# Patient Record
Sex: Female | Born: 1990 | Race: Black or African American | Hispanic: No | Marital: Single | Smoking: Current some day smoker
Health system: Southern US, Community
[De-identification: ages and names within clinical notes are randomized; demographics above are authoritative.]

## PROBLEM LIST (undated history)

## (undated) ENCOUNTER — Inpatient Hospital Stay (HOSPITAL_COMMUNITY): Payer: Self-pay

## (undated) DIAGNOSIS — Z8619 Personal history of other infectious and parasitic diseases: Secondary | ICD-10-CM

## (undated) DIAGNOSIS — J45909 Unspecified asthma, uncomplicated: Secondary | ICD-10-CM

## (undated) HISTORY — DX: Personal history of other infectious and parasitic diseases: Z86.19

## (undated) HISTORY — PX: FINGER SURGERY: SHX640

## (undated) HISTORY — PX: WISDOM TOOTH EXTRACTION: SHX21

---
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
## 2010-02-13 ENCOUNTER — Ambulatory Visit (HOSPITAL_COMMUNITY)
Admission: RE | Admit: 2010-02-13 | Discharge: 2010-02-13 | Payer: Self-pay | Source: Home / Self Care | Attending: Family Medicine | Admitting: Family Medicine

## 2010-02-24 ENCOUNTER — Inpatient Hospital Stay (HOSPITAL_COMMUNITY)
Admission: AD | Admit: 2010-02-24 | Discharge: 2010-02-27 | Payer: Self-pay | Source: Home / Self Care | Attending: Obstetrics & Gynecology | Admitting: Obstetrics & Gynecology

## 2010-02-26 LAB — RAPID URINE DRUG SCREEN, HOSP PERFORMED
Amphetamines: NOT DETECTED
Barbiturates: NOT DETECTED
Benzodiazepines: NOT DETECTED
Cocaine: NOT DETECTED
Opiates: NOT DETECTED
Tetrahydrocannabinol: NOT DETECTED

## 2010-02-26 LAB — CBC
HCT: 32 % — ABNORMAL LOW (ref 36.0–46.0)
Hemoglobin: 10.6 g/dL — ABNORMAL LOW (ref 12.0–15.0)
MCH: 28 pg (ref 26.0–34.0)
MCHC: 33.1 g/dL (ref 30.0–36.0)
MCV: 84.4 fL (ref 78.0–100.0)
Platelets: 188 10*3/uL (ref 150–400)
RBC: 3.79 MIL/uL — ABNORMAL LOW (ref 3.87–5.11)
RDW: 16.5 % — ABNORMAL HIGH (ref 11.5–15.5)
WBC: 11.1 10*3/uL — ABNORMAL HIGH (ref 4.0–10.5)

## 2010-02-26 LAB — RAPID HIV SCREEN (WH-MAU): Rapid HIV Screen: NONREACTIVE

## 2010-02-26 LAB — ABO/RH: ABO/RH(D): O POS

## 2010-02-26 LAB — RPR: RPR Ser Ql: NONREACTIVE

## 2010-02-28 LAB — CHLAMYDIA PROBE AMPLIFICATION, URINE: Chlamydia, Swab/Urine, PCR: NEGATIVE

## 2010-04-19 ENCOUNTER — Ambulatory Visit: Payer: Self-pay | Admitting: Obstetrics & Gynecology

## 2014-03-26 ENCOUNTER — Emergency Department (HOSPITAL_COMMUNITY)
Admission: EM | Admit: 2014-03-26 | Discharge: 2014-03-26 | Disposition: A | Discharge status: Home / Self Care | Payer: Medicaid Other | Attending: Emergency Medicine | Admitting: Emergency Medicine

## 2014-03-26 ENCOUNTER — Encounter (HOSPITAL_COMMUNITY): Payer: Self-pay | Admitting: Emergency Medicine

## 2014-03-26 ENCOUNTER — Emergency Department (HOSPITAL_COMMUNITY): Payer: Medicaid Other

## 2014-03-26 DIAGNOSIS — O99511 Diseases of the respiratory system complicating pregnancy, first trimester: Secondary | ICD-10-CM | POA: Diagnosis not present

## 2014-03-26 DIAGNOSIS — O9989 Other specified diseases and conditions complicating pregnancy, childbirth and the puerperium: Secondary | ICD-10-CM | POA: Diagnosis not present

## 2014-03-26 DIAGNOSIS — Z3A10 10 weeks gestation of pregnancy: Secondary | ICD-10-CM | POA: Insufficient documentation

## 2014-03-26 DIAGNOSIS — O209 Hemorrhage in early pregnancy, unspecified: Secondary | ICD-10-CM | POA: Diagnosis present

## 2014-03-26 DIAGNOSIS — R1084 Generalized abdominal pain: Secondary | ICD-10-CM | POA: Diagnosis not present

## 2014-03-26 DIAGNOSIS — F17200 Nicotine dependence, unspecified, uncomplicated: Secondary | ICD-10-CM | POA: Diagnosis not present

## 2014-03-26 DIAGNOSIS — J45909 Unspecified asthma, uncomplicated: Secondary | ICD-10-CM | POA: Insufficient documentation

## 2014-03-26 DIAGNOSIS — O99331 Smoking (tobacco) complicating pregnancy, first trimester: Secondary | ICD-10-CM | POA: Diagnosis not present

## 2014-03-26 DIAGNOSIS — Z3490 Encounter for supervision of normal pregnancy, unspecified, unspecified trimester: Secondary | ICD-10-CM

## 2014-03-26 HISTORY — DX: Unspecified asthma, uncomplicated: J45.909

## 2014-03-26 LAB — URINALYSIS, ROUTINE W REFLEX MICROSCOPIC
Bilirubin Urine: NEGATIVE
Glucose, UA: NEGATIVE mg/dL
Hgb urine dipstick: NEGATIVE
Ketones, ur: NEGATIVE mg/dL
Leukocytes, UA: NEGATIVE
Nitrite: NEGATIVE
Protein, ur: NEGATIVE mg/dL
Specific Gravity, Urine: 1.022 (ref 1.005–1.030)
Urobilinogen, UA: 1 mg/dL (ref 0.0–1.0)
pH: 7 (ref 5.0–8.0)

## 2014-03-26 LAB — COMPREHENSIVE METABOLIC PANEL
ALT: 11 U/L (ref 0–35)
AST: 18 U/L (ref 0–37)
Albumin: 3.6 g/dL (ref 3.5–5.2)
Alkaline Phosphatase: 55 U/L (ref 39–117)
Anion gap: 8 (ref 5–15)
BUN: <5 mg/dL — ABNORMAL LOW (ref 6–23)
CO2: 24 mmol/L (ref 19–32)
Calcium: 9 mg/dL (ref 8.4–10.5)
Chloride: 103 mmol/L (ref 96–112)
Creatinine, Ser: 0.61 mg/dL (ref 0.50–1.10)
GFR calc Af Amer: >90 mL/min (ref >90)
GFR calc non Af Amer: >90 mL/min (ref >90)
Glucose, Bld: 101 mg/dL — ABNORMAL HIGH (ref 70–99)
Potassium: 3 mmol/L — ABNORMAL LOW (ref 3.5–5.1)
Sodium: 135 mmol/L (ref 135–145)
Total Bilirubin: 0.7 mg/dL (ref 0.3–1.2)
Total Protein: 6.8 g/dL (ref 6.0–8.3)

## 2014-03-26 LAB — CBC WITH DIFFERENTIAL/PLATELET
Basophils Absolute: 0 10*3/uL (ref 0.0–0.1)
Basophils Relative: 0 % (ref 0–1)
Eosinophils Absolute: 0.1 10*3/uL (ref 0.0–0.7)
Eosinophils Relative: 1 % (ref 0–5)
HCT: 35.3 % — ABNORMAL LOW (ref 36.0–46.0)
Hemoglobin: 11.4 g/dL — ABNORMAL LOW (ref 12.0–15.0)
Lymphocytes Relative: 28 % (ref 12–46)
Lymphs Abs: 1.6 10*3/uL (ref 0.7–4.0)
MCH: 27.3 pg (ref 26.0–34.0)
MCHC: 32.3 g/dL (ref 30.0–36.0)
MCV: 84.4 fL (ref 78.0–100.0)
Monocytes Absolute: 0.4 10*3/uL (ref 0.1–1.0)
Monocytes Relative: 7 % (ref 3–12)
Neutro Abs: 3.6 10*3/uL (ref 1.7–7.7)
Neutrophils Relative %: 64 % (ref 43–77)
Platelets: 215 10*3/uL (ref 150–400)
RBC: 4.18 MIL/uL (ref 3.87–5.11)
RDW: 13.8 % (ref 11.5–15.5)
WBC: 5.7 10*3/uL (ref 4.0–10.5)

## 2014-03-26 LAB — WET PREP, GENITAL
Clue Cells Wet Prep HPF POC: NONE SEEN
Trich, Wet Prep: NONE SEEN
Yeast Wet Prep HPF POC: NONE SEEN

## 2014-03-26 LAB — POC URINE PREG, ED: Preg Test, Ur: POSITIVE — AB

## 2014-03-26 MED ORDER — POTASSIUM CHLORIDE CRYS ER 20 MEQ PO TBCR
40.0000 meq | EXTENDED_RELEASE_TABLET | Freq: Once | ORAL | Status: AC
  Administered 2014-03-26: 40 meq via ORAL
  Filled 2014-03-26: qty 2
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
## 2014-03-26 NOTE — ED Notes (Signed)
Pt returned from US
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
## 2014-03-26 NOTE — ED Notes (Signed)
US called to notify nurse that pt was 3rd in line for US procedure.

## 2014-03-26 NOTE — ED Notes (Signed)
Pt made aware to return if symptoms worsen or if any life threatening symptoms occur.  Dr. Freida BusmanAllen made aware of bp, okay to send pt home.
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
## 2014-03-28 LAB — GC/CHLAMYDIA PROBE AMP (~~LOC~~) NOT AT ARMC
Chlamydia: NEGATIVE
Neisseria Gonorrhea: NEGATIVE

## 2014-06-01 ENCOUNTER — Emergency Department (HOSPITAL_COMMUNITY)
Admission: EM | Admit: 2014-06-01 | Discharge: 2014-06-01 | Disposition: A | Discharge status: Home / Self Care | Payer: Medicaid Other | Attending: Emergency Medicine | Admitting: Emergency Medicine

## 2014-06-01 ENCOUNTER — Encounter (HOSPITAL_COMMUNITY): Payer: Self-pay

## 2014-06-01 DIAGNOSIS — O99331 Smoking (tobacco) complicating pregnancy, first trimester: Secondary | ICD-10-CM | POA: Diagnosis not present

## 2014-06-01 DIAGNOSIS — O99511 Diseases of the respiratory system complicating pregnancy, first trimester: Secondary | ICD-10-CM | POA: Diagnosis not present

## 2014-06-01 DIAGNOSIS — F17210 Nicotine dependence, cigarettes, uncomplicated: Secondary | ICD-10-CM | POA: Insufficient documentation

## 2014-06-01 DIAGNOSIS — O209 Hemorrhage in early pregnancy, unspecified: Secondary | ICD-10-CM | POA: Diagnosis present

## 2014-06-01 DIAGNOSIS — Z3491 Encounter for supervision of normal pregnancy, unspecified, first trimester: Secondary | ICD-10-CM

## 2014-06-01 DIAGNOSIS — J45909 Unspecified asthma, uncomplicated: Secondary | ICD-10-CM | POA: Diagnosis not present

## 2014-06-01 DIAGNOSIS — Z3A00 Weeks of gestation of pregnancy not specified: Secondary | ICD-10-CM | POA: Insufficient documentation

## 2014-06-01 LAB — WET PREP, GENITAL
CLUE CELLS WET PREP: NONE SEEN
TRICH WET PREP: NONE SEEN
Yeast Wet Prep HPF POC: NONE SEEN

## 2014-06-01 LAB — URINALYSIS, ROUTINE W REFLEX MICROSCOPIC
Bilirubin Urine: NEGATIVE
Glucose, UA: NEGATIVE mg/dL
Hgb urine dipstick: NEGATIVE
Ketones, ur: NEGATIVE mg/dL
Nitrite: NEGATIVE
Protein, ur: NEGATIVE mg/dL
Specific Gravity, Urine: 1.017 (ref 1.005–1.030)
Urobilinogen, UA: 1 mg/dL (ref 0.0–1.0)
pH: 7 (ref 5.0–8.0)

## 2014-06-01 LAB — URINE MICROSCOPIC-ADD ON

## 2014-06-01 LAB — POC URINE PREG, ED: Preg Test, Ur: POSITIVE — AB

## 2014-06-01 LAB — HCG, QUANTITATIVE, PREGNANCY: hCG, Beta Chain, Quant, S: 53692 m[IU]/mL — ABNORMAL HIGH (ref <5)

## 2014-06-01 NOTE — ED Notes (Signed)
Pt reports vaginal spotting with lower abdominal pain that began yesterday.  Pt reports her LMP was at the beginning of December and she has her first OB appointment tomorrow.  Pt reports she has a hx of ovarian cysts with her previous pregnancy.

## 2014-06-01 NOTE — ED Notes (Signed)
Pelvic cart at bedside. 

## 2014-06-01 NOTE — ED Provider Notes (Signed)
CSN: [id number]     Arrival date & time 06/01/14  [id number] History   First MD Initiated Contact with Patient 06/01/14 [phone]     Chief Complaint  Patient presents with  . Vaginal Bleeding     (Consider location/radiation/quality/duration/timing/severity/associated sxs/prior Treatment) HPI   Sheryl Berg is a 24 y.o. female who presents for evaluation of lower abdominal pain present for several days, on and off, similar to prior pain when she had a "ovarian cyst during her previous pregnancy several years ago. She is also had some spotting, per vagina. She denies weakness, dizziness, nausea or vomiting. She has a prenatal appointment scheduled for tomorrow. Her last menstrual period is 01/17/2014. She previously had a pelvic ultrasound done to verify intrauterine pregnancy, 2 months ago. She denies fever, chills, cough, shortness breath or chest pain.   Past Medical History  Diagnosis Date  . Asthma    History reviewed. No pertinent past surgical history. History reviewed. No pertinent family history. History  Substance Use Topics  . Smoking status: Current Some Day Smoker  . Smokeless tobacco: Not on file  . Alcohol Use: No   OB History    Gravida Para Term Preterm AB TAB SAB Ectopic Multiple Living   1              Review of Systems  All other systems reviewed and are negative.     Allergies  Review of patient's allergies indicates no known allergies.  Home Medications   Prior to Admission medications   Medication Sig Start Date End Date Taking? Authorizing Provider  Prenatal Vit-Fe Fumarate-FA (PRENATAL PO) Take 1 tablet by mouth daily.   Yes Historical Provider, MD   BP 108/49 mmHg  Pulse 56  Temp(Src) 98.9 F (37.2 C)  SpO2 100%  LMP 01/17/2014 (Approximate) Physical Exam  Constitutional: She is oriented to person, place, and time. She appears well-developed and well-nourished. No distress.  HENT:  Head: Normocephalic and atraumatic.  Right Ear: External  ear normal.  Left Ear: External ear normal.  Eyes: Conjunctivae and EOM are normal. Pupils are equal, round, and reactive to light.  Neck: Normal range of motion and phonation normal. Neck supple.  Cardiovascular: Normal rate, regular rhythm and normal heart sounds.   Pulmonary/Chest: Effort normal and breath sounds normal. She exhibits no bony tenderness.  Abdominal: Soft. There is no tenderness.  Genitourinary:  Normal external female genitalia. Small amount of white vaginal discharge, wet prep sent. On bimanual examination the uterus is enlarged. There is no adnexal mass. There is mild diffuse right-sided adnexal tenderness. Cervix is long and closed. No blood in vagina.  Musculoskeletal: Normal range of motion.  Neurological: She is alert and oriented to person, place, and time. No cranial nerve deficit or sensory deficit. She exhibits normal muscle tone. Coordination normal.  Skin: Skin is warm, dry and intact.  Psychiatric: She has a normal mood and affect. Her behavior is normal. Judgment and thought content normal.  Nursing note and vitals reviewed.   ED Course  Procedures (including critical care time)  Fetal heart tones 144, normal.   Medications - No data to display  Patient Vitals for the past 24 hrs:  BP Temp Pulse SpO2  06/01/14 0844 (!) 108/49 mmHg 98.9 F (37.2 C) (!) 56 100 %    12:45 PM Reevaluation with update and discussion. After initial assessment and treatment, an updated evaluation reveals no further complaints. She remains comfortable and cooperative.Mancel Bale. Shauntelle Jamerson L    Medications - No  data to display  Patient Vitals for the past 24 hrs:  BP Temp Pulse SpO2  06/01/14 0844 (!) 108/49 mmHg 98.9 F (37.2 C) (!) 56 100 %    1:09 PM Reevaluation with update and discussion. After initial assessment and treatment, an updated evaluation reveals she is comfortable. No further c/o. Findings discussed with patient, all questions answered.Mancel Bale L     Labs Review Labs Reviewed  HCG, QUANTITATIVE, PREGNANCY - Abnormal; Notable for the following:    hCG, Beta Chain, Quant, [place] [address] (*)    All other components within normal limits  URINALYSIS, ROUTINE W REFLEX MICROSCOPIC - Abnormal; Notable for the following:    Leukocytes, UA TRACE (*)    All other components within normal limits  URINE MICROSCOPIC-ADD ON - Abnormal; Notable for the following:    Squamous Epithelial / LPF FEW (*)    Casts HYALINE CASTS (*)    All other components within normal limits  POC URINE PREG, ED - Abnormal; Notable for the following:    Preg Test, Ur POSITIVE (*)    All other components within normal limits  RPR  HIV ANTIBODY (ROUTINE TESTING)  GC/CHLAMYDIA PROBE AMP ([place])    Imaging Review No results found.   EKG Interpretation None      MDM   Final diagnoses:  None    Low abdominal pain in early pregnancy, most likely round ligament pain. She reports vaginal bleeding, but none seen on clinical examination. She has short-term follow-up scheduled for tomorrow with her new obstetrician for this pregnancy. There is no indication for further evaluation or treatment in the ED setting at this time.  Nursing Notes Reviewed/ Care Coordinated Applicable Imaging Reviewed Interpretation of Laboratory Data incorporated into ED treatment  The patient appears reasonably screened and/or stabilized for discharge and I doubt any other medical condition or other [facility] requiring further screening, evaluation, or treatment in the ED at this time prior to discharge.  Plan: Home Medications- usual; Home Treatments- rest; return here if the recommended treatment, does not improve the symptoms; Recommended follow up- OB tomorrow    Mancel Bale, MD 06/01/14 1313

## 2014-06-01 NOTE — Discharge Instructions (Signed)
First Trimester of Pregnancy The first trimester of pregnancy is from week 1 until the end of week 12 (months 1 through 3). A week after a sperm fertilizes an egg, the egg will implant on the wall of the uterus. This embryo will begin to develop into a baby. Genes from you and your partner are forming the baby. The female genes determine whether the baby is a boy or a girl. At 6-8 weeks, the eyes and face are formed, and the heartbeat can be seen on ultrasound. At the end of 12 weeks, all the baby's organs are formed.  Now that you are pregnant, you will want to do everything you can to have a healthy baby. Two of the most important things are to get good prenatal care and to follow your health care provider's instructions. Prenatal care is all the medical care you receive before the baby's birth. This care will help prevent, find, and treat any problems during the pregnancy and childbirth. BODY CHANGES Your body goes through many changes during pregnancy. The changes vary from woman to woman.   You may gain or lose a couple of pounds at first.  You may feel sick to your stomach (nauseous) and throw up (vomit). If the vomiting is uncontrollable, call your health care provider.  You may tire easily.  You may develop headaches that can be relieved by medicines approved by your health care provider.  You may urinate more often. Painful urination may mean you have a bladder infection.  You may develop heartburn as a result of your pregnancy.  You may develop constipation because certain hormones are causing the muscles that push waste through your intestines to slow down.  You may develop hemorrhoids or swollen, bulging veins (varicose veins).  Your breasts may begin to grow larger and become tender. Your nipples may stick out more, and the tissue that surrounds them (areola) may become darker.  Your gums may bleed and may be sensitive to brushing and flossing.  Dark spots or blotches (chloasma,  mask of pregnancy) may develop on your face. This will likely fade after the baby is born.  Your menstrual periods will stop.  You may have a loss of appetite.  You may develop cravings for certain kinds of food.  You may have changes in your emotions from day to day, such as being excited to be pregnant or being concerned that something may go wrong with the pregnancy and baby.  You may have more vivid and strange dreams.  You may have changes in your hair. These can include thickening of your hair, rapid growth, and changes in texture. Some women also have hair loss during or after pregnancy, or hair that feels dry or thin. Your hair will most likely return to normal after your baby is born. WHAT TO EXPECT AT YOUR PRENATAL VISITS During a routine prenatal visit:  You will be weighed to make sure you and the baby are growing normally.  Your blood pressure will be taken.  Your abdomen will be measured to track your baby's growth.  The fetal heartbeat will be listened to starting around week 10 or 12 of your pregnancy.  Test results from any previous visits will be discussed. Your health care provider may ask you:  How you are feeling.  If you are feeling the baby move.  If you have had any abnormal symptoms, such as leaking fluid, bleeding, severe headaches, or abdominal cramping.  If you have any questions. Other tests   that may be performed during your first trimester include:  Blood tests to find your blood type and to check for the presence of any previous infections. They will also be used to check for low iron levels (anemia) and Rh antibodies. Later in the pregnancy, blood tests for diabetes will be done along with other tests if problems develop.  Urine tests to check for infections, diabetes, or protein in the urine.  An ultrasound to confirm the proper growth and development of the baby.  An amniocentesis to check for possible genetic problems.  Fetal screens for  spina bifida and Down syndrome.  You may need other tests to make sure you and the baby are doing well. HOME CARE INSTRUCTIONS  Medicines  Follow your health care provider's instructions regarding medicine use. Specific medicines may be either safe or unsafe to take during pregnancy.  Take your prenatal vitamins as directed.  If you develop constipation, try taking a stool softener if your health care provider approves. Diet  Eat regular, well-balanced meals. Choose a variety of foods, such as meat or vegetable-based protein, fish, milk and low-fat dairy products, vegetables, fruits, and whole grain breads and cereals. Your health care provider will help you determine the amount of weight gain that is right for you.  Avoid raw meat and uncooked cheese. These carry germs that can cause birth defects in the baby.  Eating four or five small meals rather than three large meals a day may help relieve nausea and vomiting. If you start to feel nauseous, eating a few soda crackers can be helpful. Drinking liquids between meals instead of during meals also seems to help nausea and vomiting.  If you develop constipation, eat more high-fiber foods, such as fresh vegetables or fruit and whole grains. Drink enough fluids to keep your urine clear or pale yellow. Activity and Exercise  Exercise only as directed by your health care provider. Exercising will help you:  Control your weight.  Stay in shape.  Be prepared for labor and delivery.  Experiencing pain or cramping in the lower abdomen or low back is a good sign that you should stop exercising. Check with your health care provider before continuing normal exercises.  Try to avoid standing for long periods of time. Move your legs often if you must stand in one place for a long time.  Avoid heavy lifting.  Wear low-heeled shoes, and practice good posture.  You may continue to have sex unless your health care provider directs you  otherwise. Relief of Pain or Discomfort  Wear a good support bra for breast tenderness.   Take warm sitz baths to soothe any pain or discomfort caused by hemorrhoids. Use hemorrhoid cream if your health care provider approves.   Rest with your legs elevated if you have leg cramps or low back pain.  If you develop varicose veins in your legs, wear support hose. Elevate your feet for 15 minutes, 3-4 times a day. Limit salt in your diet. Prenatal Care  Schedule your prenatal visits by the twelfth week of pregnancy. They are usually scheduled monthly at first, then more often in the last 2 months before delivery.  Write down your questions. Take them to your prenatal visits.  Keep all your prenatal visits as directed by your health care provider. Safety  Wear your seat belt at all times when driving.  Make a list of emergency phone numbers, including numbers for family, friends, the hospital, and police and fire departments. General Tips    Ask your health care provider for a referral to a local prenatal education class. Begin classes no later than at the beginning of month 6 of your pregnancy.  Ask for help if you have counseling or nutritional needs during pregnancy. Your health care provider can offer advice or refer you to specialists for help with various needs.  Do not use hot tubs, steam rooms, or saunas.  Do not douche or use tampons or scented sanitary pads.  Do not cross your legs for long periods of time.  Avoid cat litter boxes and soil used by cats. These carry germs that can cause birth defects in the baby and possibly loss of the fetus by miscarriage or stillbirth.  Avoid all smoking, herbs, alcohol, and medicines not prescribed by your health care provider. Chemicals in these affect the formation and growth of the baby.  Schedule a dentist appointment. At home, brush your teeth with a soft toothbrush and be gentle when you floss. SEEK MEDICAL CARE IF:   You have  dizziness.  You have mild pelvic cramps, pelvic pressure, or nagging pain in the abdominal area.  You have persistent nausea, vomiting, or diarrhea.  You have a bad smelling vaginal discharge.  You have pain with urination.  You notice increased swelling in your face, hands, legs, or ankles. SEEK IMMEDIATE MEDICAL CARE IF:   You have a fever.  You are leaking fluid from your vagina.  You have spotting or bleeding from your vagina.  You have severe abdominal cramping or pain.  You have rapid weight gain or loss.  You vomit blood or material that looks like coffee grounds.  You are exposed to German measles and have never had them.  You are exposed to fifth disease or chickenpox.  You develop a severe headache.  You have shortness of breath.  You have any kind of trauma, such as from a fall or a car accident. Document Released: 01/22/2001 Document Revised: 06/14/2013 Document Reviewed: 12/08/2012 ExitCare Patient Information 2015 [employer]. This information is not intended to replace advice given to you by your health care provider. Make sure you discuss any questions you have with your health care provider.  

## 2014-06-02 LAB — GC/CHLAMYDIA PROBE AMP (~~LOC~~) NOT AT ARMC
Chlamydia: NEGATIVE
Neisseria Gonorrhea: NEGATIVE

## 2014-06-02 LAB — HIV ANTIBODY (ROUTINE TESTING W REFLEX): HIV SCREEN 4TH GENERATION: NONREACTIVE

## 2014-06-02 LAB — RPR: RPR Ser Ql: NONREACTIVE

## 2014-06-09 LAB — OB RESULTS CONSOLE RPR: RPR: NONREACTIVE

## 2014-06-09 LAB — OB RESULTS CONSOLE HEPATITIS B SURFACE ANTIGEN: Hepatitis B Surface Ag: NEGATIVE

## 2014-06-09 LAB — OB RESULTS CONSOLE ABO/RH: RH TYPE: POSITIVE

## 2014-06-09 LAB — OB RESULTS CONSOLE HIV ANTIBODY (ROUTINE TESTING): HIV: NONREACTIVE

## 2014-06-09 LAB — OB RESULTS CONSOLE ANTIBODY SCREEN: Antibody Screen: NEGATIVE

## 2014-06-09 LAB — OB RESULTS CONSOLE RUBELLA ANTIBODY, IGM: Rubella: IMMUNE

## 2014-06-09 LAB — OB RESULTS CONSOLE GC/CHLAMYDIA: Gonorrhea: NEGATIVE

## 2014-10-17 ENCOUNTER — Inpatient Hospital Stay (HOSPITAL_COMMUNITY)
Admission: AD | Admit: 2014-10-17 | Discharge: 2014-10-17 | Disposition: A | Discharge status: Home / Self Care | Payer: Medicaid Other | Source: Ambulatory Visit | Attending: Obstetrics and Gynecology | Admitting: Obstetrics and Gynecology

## 2014-10-17 ENCOUNTER — Encounter (HOSPITAL_COMMUNITY): Payer: Self-pay

## 2014-10-17 DIAGNOSIS — Z3A39 39 weeks gestation of pregnancy: Secondary | ICD-10-CM | POA: Diagnosis not present

## 2014-10-17 DIAGNOSIS — O283 Abnormal ultrasonic finding on antenatal screening of mother: Secondary | ICD-10-CM | POA: Diagnosis present

## 2014-10-17 NOTE — Progress Notes (Signed)
Pt discharged home and instructions reviewed. All belongings with pt upon discharge.

## 2014-10-17 NOTE — Progress Notes (Signed)
Pt here for NST per Dr. Ambrose Mantle. Pt states, "they've been watching her growth because I guess she has been measuring on the smaller size."

## 2014-10-17 NOTE — Progress Notes (Signed)
Pt reports good fetal movement, no loss of fluid or bleeding.

## 2014-10-17 NOTE — Discharge Instructions (Signed)
Braxton Hicks Contractions °Contractions of the uterus can occur throughout pregnancy. Contractions are not always a sign that you are in labor.  °WHAT ARE BRAXTON HICKS CONTRACTIONS?  °Contractions that occur before labor are called Braxton Hicks contractions, or false labor. Toward the end of pregnancy (32-34 weeks), these contractions can develop more often and may become more forceful. This is not true labor because these contractions do not result in opening (dilatation) and thinning of the cervix. They are sometimes difficult to tell apart from true labor because these contractions can be forceful and people have different pain tolerances. You should not feel embarrassed if you go to the hospital with false labor. Sometimes, the only way to tell if you are in true labor is for your health care provider to look for changes in the cervix. °If there are no prenatal problems or other health problems associated with the pregnancy, it is completely safe to be sent home with false labor and await the onset of true labor. °HOW CAN YOU TELL THE DIFFERENCE BETWEEN TRUE AND FALSE LABOR? °False Labor °· The contractions of false labor are usually shorter and not as hard as those of true labor.   °· The contractions are usually irregular.   °· The contractions are often felt in the front of the lower abdomen and in the groin.   °· The contractions may go away when you walk around or change positions while lying down.   °· The contractions get weaker and are shorter lasting as time goes on.   °· The contractions do not usually become progressively stronger, regular, and closer together as with true labor.   °True Labor °· Contractions in true labor last 30-70 seconds, become very regular, usually become more intense, and increase in frequency.   °· The contractions do not go away with walking.   °· The discomfort is usually felt in the top of the uterus and spreads to the lower abdomen and low back.   °· True labor can be  determined by your health care provider with an exam. This will show that the cervix is dilating and getting thinner.   °WHAT TO REMEMBER °· Keep up with your usual exercises and follow other instructions given by your health care provider.   °· Take medicines as directed by your health care provider.   °· Keep your regular prenatal appointments.   °· Eat and drink lightly if you think you are going into labor.   °· If Braxton Hicks contractions are making you uncomfortable:   °¨ Change your position from lying down or resting to walking, or from walking to resting.   °¨ Sit and rest in a tub of warm water.   °¨ Drink 2-3 glasses of water. Dehydration may cause these contractions.   °¨ Do slow and deep breathing several times an hour.   °WHEN SHOULD I SEEK IMMEDIATE MEDICAL CARE? °Seek immediate medical care if: °· Your contractions become stronger, more regular, and closer together.   °· You have fluid leaking or gushing from your vagina.   °· You have a fever.   °· You pass blood-tinged mucus.   °· You have vaginal bleeding.   °· You have continuous abdominal pain.   °· You have low back pain that you never had before.   °· You feel your baby's head pushing down and causing pelvic pressure.   °· Your baby is not moving as much as it used to.   °Document Released: 01/28/2005 Document Revised: 02/02/2013 Document Reviewed: 11/09/2012 °ExitCare® Patient Information ©2015 [employer]. This information is not intended to replace advice given to you by your health care   provider. Make sure you discuss any questions you have with your health care provider. ° °

## 2014-10-24 ENCOUNTER — Other Ambulatory Visit (HOSPITAL_COMMUNITY): Payer: Self-pay | Admitting: Obstetrics and Gynecology

## 2014-10-24 ENCOUNTER — Ambulatory Visit (HOSPITAL_COMMUNITY)
Admission: RE | Admit: 2014-10-24 | Discharge: 2014-10-24 | Disposition: A | Discharge status: Home / Self Care | Payer: Medicaid Other | Source: Ambulatory Visit | Attending: Obstetrics and Gynecology | Admitting: Obstetrics and Gynecology

## 2014-10-24 DIAGNOSIS — O288 Other abnormal findings on antenatal screening of mother: Secondary | ICD-10-CM

## 2014-10-24 DIAGNOSIS — Z3A40 40 weeks gestation of pregnancy: Secondary | ICD-10-CM | POA: Diagnosis not present

## 2014-10-24 DIAGNOSIS — O283 Abnormal ultrasonic finding on antenatal screening of mother: Secondary | ICD-10-CM | POA: Diagnosis not present

## 2014-10-24 DIAGNOSIS — O480 Post-term pregnancy: Secondary | ICD-10-CM | POA: Insufficient documentation

## 2014-10-26 ENCOUNTER — Encounter (HOSPITAL_COMMUNITY): Payer: Self-pay | Admitting: *Deleted

## 2014-10-26 ENCOUNTER — Telehealth (HOSPITAL_COMMUNITY): Payer: Self-pay | Admitting: *Deleted

## 2014-10-26 ENCOUNTER — Other Ambulatory Visit (HOSPITAL_COMMUNITY): Payer: Self-pay | Admitting: Obstetrics and Gynecology

## 2014-10-26 DIAGNOSIS — Z3490 Encounter for supervision of normal pregnancy, unspecified, unspecified trimester: Secondary | ICD-10-CM

## 2014-10-26 LAB — OB RESULTS CONSOLE GBS: STREP GROUP B AG: NEGATIVE

## 2014-10-26 MED ORDER — LACTATED RINGERS IV SOLN: INTRAVENOUS | Status: AC

## 2014-10-26 MED ORDER — OXYTOCIN 40 UNITS IN LACTATED RINGERS INFUSION - SIMPLE MED: 1.0000 m[IU]/min | INTRAVENOUS | Status: AC

## 2014-10-26 MED ORDER — OXYTOCIN BOLUS FROM INFUSION: 500.0000 mL | INTRAVENOUS | Status: AC

## 2014-10-26 MED ORDER — OXYCODONE-ACETAMINOPHEN 5-325 MG PO TABS: 1.0000 | ORAL_TABLET | ORAL | Status: AC | PRN

## 2014-10-26 MED ORDER — LACTATED RINGERS IV SOLN: 500.0000 mL | INTRAVENOUS | Status: AC | PRN

## 2014-10-26 MED ORDER — OXYTOCIN 40 UNITS IN LACTATED RINGERS INFUSION - SIMPLE MED: 62.5000 mL/h | INTRAVENOUS | Status: AC

## 2014-10-26 MED ORDER — OXYCODONE-ACETAMINOPHEN 5-325 MG PO TABS: 2.0000 | ORAL_TABLET | ORAL | Status: AC | PRN

## 2014-10-26 MED ORDER — LIDOCAINE HCL (PF) 1 % IJ SOLN: 30.0000 mL | INTRAMUSCULAR | Status: AC | PRN

## 2014-10-26 NOTE — Telephone Encounter (Signed)
Preadmission screen  

## 2014-10-26 NOTE — H&P (Signed)
NAME:  Sheryl Berg, Sheryl Berg NO.:  MEDICAL RECORD NO.:  [id number]  LOCATION:                                 FACILITY:  PHYSICIAN:  Malachi Pro. Ambrose Mantle, M.D. DATE OF BIRTH:  [birth year]  DATE OF ADMISSION:  10/27/2014 DATE OF DISCHARGE:                             HISTORY & PHYSICAL   HISTORY OF PRESENT ILLNESS:  This is a 24 year old black female, para 3- 0-1-3, gravida 5, EDC October 23, 2014, based on an ultrasound at 9 weeks and 6 days on March 26, 2014.  Blood group and type O positive, negative antibody, RPR negative.  Urine culture negative.  Hepatitis B surface antigen negative, HIV negative, GC and Chlamydia negative. Varicella immune.  Rubella immune.  Pap test normal.  Cystic fibrosis negative.  Hemoglobin AA.  Quad screen negative.  Repeat HIV and RPR negative.  GC and Chlamydia negative.  Group B strep negative.  This patient had an early ultrasound.  She began her prenatal course in our office at 20-21 weeks.  At 28 weeks, she had an audible fetal heart rate deceleration, but nonstress test was appropriate for gestational age. At 32 weeks, the patient only gained 4 pounds in the 12 weeks. Ultrasound was checked to see what the baby's size was, the baby was slightly small but fluid was normal, BPP and Dopplers were normal.  Her ultrasound initially was done at 9 weeks and 6 days and [facility] October 23, 2014.  At 36 weeks, she had gained 10 pound since 20 weeks.  The last ultrasound for growth on October 13, 2014, showed to be 18 percentile for estimated fetal weight.  Abdominal and head circumference was slightly low.  Doppler and BPP were normal.  Nonstress tests were begun twice a week, and the biophysical profile for nonreactive nonstress test on October 24, 2014, was 8/8.  The patient is now scheduled for induction of labor on October 27, 2014,  PAST MEDICAL HISTORY:  Reveals chicken pox as a child.  She has had occasional depression.  No  formal diagnosis.  SURGICAL HISTORY:  Wisdom teeth at age 83 or 50.  She had an abscess finger at age 23 and another 1 last year.  ALLERGIES:  She has no known drug allergies.  No latex or food allergies.  SOCIAL HISTORY:  She is a former smoker.  No alcohol.  Denies drugs. 12th grade education.  Works at [employer].  FAMILY HISTORY:  Mother had cancer of the breast in her 30s.  PHYSICAL EXAM:  GENERAL:  On admission well-developed well-nourished black female, in no distress. VITAL SIGNS:  Blood pressure is 110/76, pulse is 80. HEART:  Normal size and sounds.  No murmurs. LUNGS:  Clear to auscultation.  Fundal height 38 cm.  Fetal heart tones normal.  Cervix 1 cm, 20% vertex at a -4.  ADMITTING IMPRESSION:  Intrauterine pregnancy at 40 weeks and 4 days, possible small for gestational age infant.  The patient is admitted for induction of labor.     Malachi Pro. Ambrose Mantle, M.D.     TFH/MEDQ  D:  10/26/2014  T:  10/26/2014  Job:  [id number]

## 2014-10-27 ENCOUNTER — Inpatient Hospital Stay (HOSPITAL_COMMUNITY): Payer: Medicaid Other | Admitting: Anesthesiology

## 2014-10-27 ENCOUNTER — Inpatient Hospital Stay (HOSPITAL_COMMUNITY)
Admission: RE | Admit: 2014-10-27 | Discharge: 2014-10-29 | DRG: 767 | Disposition: A | Discharge status: Home / Self Care | Payer: Medicaid Other | Source: Ambulatory Visit | Attending: Obstetrics and Gynecology | Admitting: Obstetrics and Gynecology

## 2014-10-27 ENCOUNTER — Encounter (HOSPITAL_COMMUNITY): Payer: Self-pay

## 2014-10-27 DIAGNOSIS — IMO0001 Reserved for inherently not codable concepts without codable children: Secondary | ICD-10-CM

## 2014-10-27 DIAGNOSIS — Z302 Encounter for sterilization: Secondary | ICD-10-CM | POA: Diagnosis not present

## 2014-10-27 DIAGNOSIS — Z3A41 41 weeks gestation of pregnancy: Secondary | ICD-10-CM | POA: Diagnosis present

## 2014-10-27 DIAGNOSIS — Z3490 Encounter for supervision of normal pregnancy, unspecified, unspecified trimester: Secondary | ICD-10-CM

## 2014-10-27 LAB — CBC
HCT: 34.4 % — ABNORMAL LOW (ref 36.0–46.0)
HEMOGLOBIN: 11.1 g/dL — AB (ref 12.0–15.0)
MCH: 28.2 pg (ref 26.0–34.0)
MCHC: 32.3 g/dL (ref 30.0–36.0)
MCV: 87.5 fL (ref 78.0–100.0)
PLATELETS: 122 10*3/uL — AB (ref 150–400)
RBC: 3.93 MIL/uL (ref 3.87–5.11)
RDW: 14.6 % (ref 11.5–15.5)
WBC: 5.8 10*3/uL (ref 4.0–10.5)

## 2014-10-27 LAB — TYPE AND SCREEN
ABO/RH(D): O POS
Antibody Screen: NEGATIVE

## 2014-10-27 LAB — RPR: RPR: NONREACTIVE

## 2014-10-27 MED ORDER — LIDOCAINE HCL (PF) 1 % IJ SOLN
INTRAMUSCULAR | Status: DC | PRN
  Administered 2014-10-27 (×2): 4 mL via EPIDURAL

## 2014-10-27 MED ORDER — LIDOCAINE HCL (PF) 1 % IJ SOLN
30.0000 mL | INTRAMUSCULAR | Status: DC | PRN
  Filled 2014-10-27: qty 30

## 2014-10-27 MED ORDER — DIPHENHYDRAMINE HCL 50 MG/ML IJ SOLN: 12.5000 mg | INTRAMUSCULAR | Status: DC | PRN

## 2014-10-27 MED ORDER — BUPIVACAINE HCL (PF) 0.25 % IJ SOLN
INTRAMUSCULAR | Status: DC | PRN
  Administered 2014-10-27 (×2): 5 mL via EPIDURAL

## 2014-10-27 MED ORDER — PHENYLEPHRINE 40 MCG/ML (10ML) SYRINGE FOR IV PUSH (FOR BLOOD PRESSURE SUPPORT)
80.0000 ug | PREFILLED_SYRINGE | INTRAVENOUS | Status: DC | PRN
  Filled 2014-10-27: qty 20
  Filled 2014-10-27: qty 2

## 2014-10-27 MED ORDER — OXYTOCIN BOLUS FROM INFUSION
500.0000 mL | INTRAVENOUS | Status: DC
  Administered 2014-10-27: 500 mL via INTRAVENOUS

## 2014-10-27 MED ORDER — OXYTOCIN 40 UNITS IN LACTATED RINGERS INFUSION - SIMPLE MED
1.0000 m[IU]/min | INTRAVENOUS | Status: DC
  Filled 2014-10-27: qty 1000

## 2014-10-27 MED ORDER — EPHEDRINE 5 MG/ML INJ
10.0000 mg | INTRAVENOUS | Status: DC | PRN
  Filled 2014-10-27: qty 2

## 2014-10-27 MED ORDER — OXYCODONE-ACETAMINOPHEN 5-325 MG PO TABS: 1.0000 | ORAL_TABLET | ORAL | Status: DC | PRN

## 2014-10-27 MED ORDER — LACTATED RINGERS IV SOLN
500.0000 mL | INTRAVENOUS | Status: DC | PRN
  Administered 2014-10-27: 500 mL via INTRAVENOUS

## 2014-10-27 MED ORDER — OXYTOCIN 40 UNITS IN LACTATED RINGERS INFUSION - SIMPLE MED
1.0000 m[IU]/min | INTRAVENOUS | Status: DC
  Administered 2014-10-27: 2 m[IU]/min via INTRAVENOUS

## 2014-10-27 MED ORDER — OXYTOCIN 40 UNITS IN LACTATED RINGERS INFUSION - SIMPLE MED
1.0000 m[IU]/min | INTRAVENOUS | Status: DC
Start: 2014-10-27 — End: 2014-10-28
  Administered 2014-10-27: 28 m[IU]/min via INTRAVENOUS
  Administered 2014-10-27: 1 m[IU]/min via INTRAVENOUS

## 2014-10-27 MED ORDER — OXYCODONE-ACETAMINOPHEN 5-325 MG PO TABS: 2.0000 | ORAL_TABLET | ORAL | Status: DC | PRN

## 2014-10-27 MED ORDER — OXYTOCIN 40 UNITS IN LACTATED RINGERS INFUSION - SIMPLE MED
62.5000 mL/h | INTRAVENOUS | Status: DC
  Administered 2014-10-27: 62.5 mL/h via INTRAVENOUS

## 2014-10-27 MED ORDER — LACTATED RINGERS IV SOLN
INTRAVENOUS | Status: DC
  Administered 2014-10-27 (×3): via INTRAVENOUS

## 2014-10-27 MED ORDER — FENTANYL 2.5 MCG/ML BUPIVACAINE 1/10 % EPIDURAL INFUSION (WH - ANES)
14.0000 mL/h | INTRAMUSCULAR | Status: DC | PRN
  Administered 2014-10-27: 14 mL/h via EPIDURAL
  Filled 2014-10-27: qty 125

## 2014-10-27 NOTE — Anesthesia Procedure Notes (Signed)
Epidural Patient location during procedure: OB  Staffing Anesthesiologist: Avyaan Summer Performed by: anesthesiologist   Preanesthetic Checklist Completed: patient identified, site marked, surgical consent, pre-op evaluation, timeout performed, IV checked, risks and benefits discussed and monitors and equipment checked  Epidural Patient position: sitting Prep: site prepped and draped and DuraPrep Patient monitoring: continuous pulse ox and blood pressure Approach: midline Location: L3-L4 Injection technique: LOR saline  Needle:  Needle type: Tuohy  Needle gauge: 17 G Needle length: 9 cm and 9 Needle insertion depth: 5 cm cm Catheter type: closed end flexible Catheter size: 19 Gauge Catheter at skin depth: 10 cm Test dose: negative  Assessment Events: blood not aspirated, injection not painful, no injection resistance, negative IV test and no paresthesia  Additional Notes Patient identified. Risks/Benefits/Options discussed with patient including but not limited to bleeding, infection, nerve damage, paralysis, failed block, incomplete pain control, headache, blood pressure changes, nausea, vomiting, reactions to medications, itching and postpartum back pain. Confirmed with bedside nurse the patient's most recent platelet count. Confirmed with patient that they are not currently taking any anticoagulation, have any bleeding history or any family history of bleeding disorders. Patient expressed understanding and wished to proceed. All questions were answered. Sterile technique was used throughout the entire procedure. Please see nursing notes for vital signs. Test dose was given through epidural catheter and negative prior to continuing to dose epidural or start infusion. Warning signs of high block given to the patient including shortness of breath, tingling/numbness in hands, complete motor block, or any concerning symptoms with instructions to call for help. Patient was given  instructions on fall risk and not to get out of bed. All questions and concerns addressed with instructions to call with any issues or inadequate analgesia.  Reason for block:procedure for pain

## 2014-10-27 NOTE — Progress Notes (Signed)
Patient ID: Sheryl Berg, female   DOB: Oct 12, 1990, 24 y.o.   MRN: [id number] Delivery note:  The pt progressed rapidly to full dilatation with the vertex ROP. She pushed well to a living female infant spontaneously ROP over a first degree ML laceration.Apgars were 9 and 9 at 1 and 5 minutes. The placenta delivered intact and the uterus was normal. The small laceration was sutured with 3-0 vicryl. EBL 250 cc's. The pt wants BTL.

## 2014-10-27 NOTE — Progress Notes (Signed)
Patient ID: Sheryl Berg, female   DOB: 07/19/1990, 24 y.o.   MRN: [id number] Pt is contracting and requests an epidural. The cervix is 2+ cm 50 % effaced and the vertex is at - 3 station.

## 2014-10-27 NOTE — Progress Notes (Signed)
Patient ID: Sheryl Berg, female   DOB: 10-04-1990, 24 y.o.   MRN: [id number] Pt is contracting q 2-3 minutes and the cervix is 2 cm 40% effaced and the vertex is at - 3 station. AROM produced clear fluid.

## 2014-10-27 NOTE — Progress Notes (Signed)
Patient ID: Sheryl Berg, female   DOB: 1990/04/25, 24 y.o.   MRN: [id number] Pitocin at 24 mu/minute Cervix is 3 cm 50 % effaced and the vertex is at -1/-2 station.

## 2014-10-27 NOTE — Anesthesia Preprocedure Evaluation (Signed)
Anesthesia Evaluation  Patient identified by MRN, date of birth, ID band Patient awake    Reviewed: Allergy & Precautions, H&P , Patient's Chart, lab work & pertinent test results  Airway Mallampati: I  TM Distance: >3 FB Neck ROM: full    Dental no notable dental hx.    Pulmonary asthma ,    Pulmonary exam normal breath sounds clear to auscultation       Cardiovascular negative cardio ROS Normal cardiovascular exam Rhythm:regular Rate:Normal     Neuro/Psych negative neurological ROS  negative psych ROS   GI/Hepatic negative GI ROS, Neg liver ROS,   Endo/Other  negative endocrine ROS  Renal/GU negative Renal ROS  negative genitourinary   Musculoskeletal   Abdominal   Peds  Hematology negative hematology ROS (+)   Anesthesia Other Findings Pregnancy - 4th child, has had 3 epidurals prior Platelets and allergies reviewed Denies active cardiac or pulmonary symptoms, METS > 4  Denies blood thinning medications, bleeding disorders, hypertension, supine hypotension syndrome, previous anesthesia difficulties    Reproductive/Obstetrics (+) Pregnancy                             Anesthesia Physical Anesthesia Plan  ASA: II  Anesthesia Plan: Epidural   Post-op Pain Management:    Induction:   Airway Management Planned:   Additional Equipment:   Intra-op Plan:   Post-operative Plan:   Informed Consent: I have reviewed the patients History and Physical, chart, labs and discussed the procedure including the risks, benefits and alternatives for the proposed anesthesia with the patient or authorized representative who has indicated his/her understanding and acceptance.     Plan Discussed with: Anesthesiologist  Anesthesia Plan Comments:         Anesthesia Quick Evaluation

## 2014-10-28 ENCOUNTER — Inpatient Hospital Stay (HOSPITAL_COMMUNITY): Payer: Medicaid Other | Admitting: Anesthesiology

## 2014-10-28 ENCOUNTER — Encounter (HOSPITAL_COMMUNITY)
Admission: RE | Disposition: A | Discharge status: Home / Self Care | Payer: Self-pay | Source: Ambulatory Visit | Attending: Obstetrics and Gynecology

## 2014-10-28 ENCOUNTER — Encounter (HOSPITAL_COMMUNITY): Payer: Self-pay

## 2014-10-28 HISTORY — PX: TUBAL LIGATION: SHX77

## 2014-10-28 LAB — CBC
HCT: 35.8 % — ABNORMAL LOW (ref 36.0–46.0)
HEMATOCRIT: 34.5 % — AB (ref 36.0–46.0)
HEMOGLOBIN: 11.3 g/dL — AB (ref 12.0–15.0)
Hemoglobin: 11.6 g/dL — ABNORMAL LOW (ref 12.0–15.0)
MCH: 28.4 pg (ref 26.0–34.0)
MCH: 29 pg (ref 26.0–34.0)
MCHC: 32.4 g/dL (ref 30.0–36.0)
MCHC: 32.8 g/dL (ref 30.0–36.0)
MCV: 87.5 fL (ref 78.0–100.0)
MCV: 88.7 fL (ref 78.0–100.0)
PLATELETS: 114 10*3/uL — AB (ref 150–400)
Platelets: 111 10*3/uL — ABNORMAL LOW (ref 150–400)
RBC: 4.09 MIL/uL (ref 3.87–5.11)
RBC: 3.89 MIL/uL (ref 3.87–5.11)
RDW: 14.3 % (ref 11.5–15.5)
RDW: 14.5 % (ref 11.5–15.5)
WBC: 8.9 10*3/uL (ref 4.0–10.5)
WBC: 8 10*3/uL (ref 4.0–10.5)

## 2014-10-28 LAB — CCBB MATERNAL DONOR DRAW

## 2014-10-28 SURGERY — LIGATION, FALLOPIAN TUBE, POSTPARTUM
Anesthesia: Epidural | Site: Abdomen

## 2014-10-28 MED ORDER — DEXAMETHASONE SODIUM PHOSPHATE 10 MG/ML IJ SOLN
INTRAMUSCULAR | Status: AC
  Filled 2014-10-28: qty 1

## 2014-10-28 MED ORDER — TETANUS-DIPHTH-ACELL PERTUSSIS 5-2.5-18.5 LF-MCG/0.5 IM SUSP: 0.5000 mL | Freq: Once | INTRAMUSCULAR | Status: DC

## 2014-10-28 MED ORDER — LACTATED RINGERS IV SOLN
INTRAVENOUS | Status: DC | PRN
  Administered 2014-10-28: 12:00:00 via INTRAVENOUS

## 2014-10-28 MED ORDER — SODIUM BICARBONATE 8.4 % IV SOLN
INTRAVENOUS | Status: AC
  Filled 2014-10-28: qty 50

## 2014-10-28 MED ORDER — IBUPROFEN 600 MG PO TABS
600.0000 mg | ORAL_TABLET | Freq: Four times a day (QID) | ORAL | Status: DC
  Administered 2014-10-28 – 2014-10-29 (×5): 600 mg via ORAL
  Filled 2014-10-28 (×5): qty 1

## 2014-10-28 MED ORDER — SIMETHICONE 80 MG PO CHEW: 80.0000 mg | CHEWABLE_TABLET | ORAL | Status: DC | PRN

## 2014-10-28 MED ORDER — WITCH HAZEL-GLYCERIN EX PADS: 1.0000 "application " | MEDICATED_PAD | CUTANEOUS | Status: DC | PRN

## 2014-10-28 MED ORDER — MEPERIDINE HCL 25 MG/ML IJ SOLN: 6.2500 mg | INTRAMUSCULAR | Status: DC | PRN

## 2014-10-28 MED ORDER — PROPOFOL 10 MG/ML IV BOLUS
INTRAVENOUS | Status: AC
  Filled 2014-10-28: qty 20

## 2014-10-28 MED ORDER — FENTANYL CITRATE (PF) 100 MCG/2ML IJ SOLN
INTRAMUSCULAR | Status: AC
  Filled 2014-10-28: qty 4

## 2014-10-28 MED ORDER — ACETAMINOPHEN 325 MG PO TABS: 650.0000 mg | ORAL_TABLET | ORAL | Status: DC | PRN

## 2014-10-28 MED ORDER — LIDOCAINE-EPINEPHRINE (PF) 2 %-1:200000 IJ SOLN
INTRAMUSCULAR | Status: AC
  Filled 2014-10-28: qty 20

## 2014-10-28 MED ORDER — PRENATAL 27-0.8 MG PO TABS
1.0000 | ORAL_TABLET | Freq: Every day | ORAL | Status: DC
  Filled 2014-10-28: qty 1

## 2014-10-28 MED ORDER — SODIUM BICARBONATE 8.4 % IV SOLN
INTRAVENOUS | Status: DC | PRN
  Administered 2014-10-28 (×4): 5 mL via EPIDURAL

## 2014-10-28 MED ORDER — DEXAMETHASONE SODIUM PHOSPHATE 4 MG/ML IJ SOLN
INTRAMUSCULAR | Status: DC | PRN
  Administered 2014-10-28: 4 mg via INTRAVENOUS

## 2014-10-28 MED ORDER — DIBUCAINE 1 % RE OINT: 1.0000 "application " | TOPICAL_OINTMENT | RECTAL | Status: DC | PRN

## 2014-10-28 MED ORDER — DIPHENHYDRAMINE HCL 25 MG PO CAPS: 25.0000 mg | ORAL_CAPSULE | Freq: Four times a day (QID) | ORAL | Status: DC | PRN

## 2014-10-28 MED ORDER — MEASLES, MUMPS & RUBELLA VAC ~~LOC~~ INJ
0.5000 mL | INJECTION | Freq: Once | SUBCUTANEOUS | Status: DC
  Filled 2014-10-28: qty 0.5

## 2014-10-28 MED ORDER — LIDOCAINE HCL (CARDIAC) 20 MG/ML IV SOLN
INTRAVENOUS | Status: AC
  Filled 2014-10-28: qty 5

## 2014-10-28 MED ORDER — OXYCODONE-ACETAMINOPHEN 5-325 MG PO TABS
2.0000 | ORAL_TABLET | ORAL | Status: DC | PRN
  Administered 2014-10-28: 2 via ORAL
  Filled 2014-10-28: qty 2

## 2014-10-28 MED ORDER — OXYTOCIN 40 UNITS IN LACTATED RINGERS INFUSION - SIMPLE MED
INTRAVENOUS | Status: AC
  Filled 2014-10-28: qty 1000

## 2014-10-28 MED ORDER — ONDANSETRON HCL 4 MG/2ML IJ SOLN: INTRAMUSCULAR | Status: DC | PRN

## 2014-10-28 MED ORDER — METOCLOPRAMIDE HCL 5 MG/ML IJ SOLN: 10.0000 mg | Freq: Once | INTRAMUSCULAR | Status: DC | PRN

## 2014-10-28 MED ORDER — FENTANYL CITRATE (PF) 100 MCG/2ML IJ SOLN
25.0000 ug | INTRAMUSCULAR | Status: DC | PRN
  Administered 2014-10-28 (×2): 50 ug via INTRAVENOUS

## 2014-10-28 MED ORDER — PRENATAL MULTIVITAMIN CH
1.0000 | ORAL_TABLET | Freq: Every day | ORAL | Status: DC
  Administered 2014-10-29: 1 via ORAL
  Filled 2014-10-28: qty 1

## 2014-10-28 MED ORDER — OXYTOCIN 40 UNITS IN LACTATED RINGERS INFUSION - SIMPLE MED
62.5000 mL/h | INTRAVENOUS | Status: AC
Start: 2014-10-28 — End: 2014-10-28
  Administered 2014-10-28: 62.5 mL/h via INTRAVENOUS

## 2014-10-28 MED ORDER — FENTANYL CITRATE (PF) 100 MCG/2ML IJ SOLN
INTRAMUSCULAR | Status: AC
  Filled 2014-10-28: qty 2

## 2014-10-28 MED ORDER — LACTATED RINGERS IV SOLN
INTRAVENOUS | Status: DC
  Administered 2014-10-28: 11:00:00 via INTRAVENOUS

## 2014-10-28 MED ORDER — ONDANSETRON HCL 4 MG PO TABS: 4.0000 mg | ORAL_TABLET | ORAL | Status: DC | PRN

## 2014-10-28 MED ORDER — ONDANSETRON HCL 4 MG/2ML IJ SOLN: 4.0000 mg | INTRAMUSCULAR | Status: DC | PRN

## 2014-10-28 MED ORDER — LACTATED RINGERS IV SOLN: INTRAVENOUS | Status: AC

## 2014-10-28 MED ORDER — FENTANYL CITRATE (PF) 250 MCG/5ML IJ SOLN
INTRAMUSCULAR | Status: AC
  Filled 2014-10-28: qty 5

## 2014-10-28 MED ORDER — OXYCODONE-ACETAMINOPHEN 5-325 MG PO TABS
1.0000 | ORAL_TABLET | ORAL | Status: DC | PRN
  Administered 2014-10-28 (×2): 1 via ORAL
  Filled 2014-10-28 (×2): qty 1

## 2014-10-28 MED ORDER — LANOLIN HYDROUS EX OINT: TOPICAL_OINTMENT | CUTANEOUS | Status: DC | PRN

## 2014-10-28 MED ORDER — MIDAZOLAM HCL 2 MG/2ML IJ SOLN
INTRAMUSCULAR | Status: AC
  Filled 2014-10-28: qty 4

## 2014-10-28 MED ORDER — BUDESONIDE-FORMOTEROL FUMARATE 160-4.5 MCG/ACT IN AERO
2.0000 | INHALATION_SPRAY | Freq: Two times a day (BID) | RESPIRATORY_TRACT | Status: DC | PRN
  Filled 2014-10-28: qty 6

## 2014-10-28 MED ORDER — METOCLOPRAMIDE HCL 10 MG PO TABS
10.0000 mg | ORAL_TABLET | Freq: Once | ORAL | Status: AC
  Administered 2014-10-28: 10 mg via ORAL
  Filled 2014-10-28: qty 1

## 2014-10-28 MED ORDER — FAMOTIDINE 20 MG PO TABS
40.0000 mg | ORAL_TABLET | Freq: Once | ORAL | Status: AC
  Administered 2014-10-28: 40 mg via ORAL
  Filled 2014-10-28: qty 2

## 2014-10-28 MED ORDER — BENZOCAINE-MENTHOL 20-0.5 % EX AERO: 1.0000 "application " | INHALATION_SPRAY | CUTANEOUS | Status: DC | PRN

## 2014-10-28 MED ORDER — ONDANSETRON HCL 4 MG/2ML IJ SOLN
INTRAMUSCULAR | Status: AC
  Filled 2014-10-28: qty 2

## 2014-10-28 MED ORDER — KETOROLAC TROMETHAMINE 30 MG/ML IJ SOLN: 30.0000 mg | Freq: Once | INTRAMUSCULAR | Status: DC

## 2014-10-28 MED ORDER — MIDAZOLAM HCL 2 MG/2ML IJ SOLN
INTRAMUSCULAR | Status: AC
  Filled 2014-10-28: qty 2

## 2014-10-28 MED ORDER — ONDANSETRON HCL 4 MG/2ML IJ SOLN
INTRAMUSCULAR | Status: DC | PRN
  Administered 2014-10-28: 4 mg via INTRAVENOUS

## 2014-10-28 MED ORDER — SENNOSIDES-DOCUSATE SODIUM 8.6-50 MG PO TABS
2.0000 | ORAL_TABLET | ORAL | Status: DC
  Administered 2014-10-29: 2 via ORAL
  Filled 2014-10-28: qty 2

## 2014-10-28 MED ORDER — ZOLPIDEM TARTRATE 5 MG PO TABS: 5.0000 mg | ORAL_TABLET | Freq: Every evening | ORAL | Status: DC | PRN

## 2014-10-28 SURGICAL SUPPLY — 26 items
CLOTH BEACON ORANGE TIMEOUT ST (SAFETY) ×3 IMPLANT
CONTAINER PREFILL 10% NBF 15ML (MISCELLANEOUS) ×6 IMPLANT
DRSG OPSITE POSTOP 3X4 (GAUZE/BANDAGES/DRESSINGS) ×3 IMPLANT
ELECT REM PT RETURN 9FT ADLT (ELECTROSURGICAL) ×3
ELECTRODE REM PT RTRN 9FT ADLT (ELECTROSURGICAL) ×1 IMPLANT
GLOVE BIO SURGEON STRL SZ7.5 (GLOVE) ×3 IMPLANT
GOWN STRL REUS W/TWL LRG LVL3 (GOWN DISPOSABLE) ×6 IMPLANT
NS IRRIG 1000ML POUR BTL (IV SOLUTION) ×3 IMPLANT
PACK ABDOMINAL MINOR (CUSTOM PROCEDURE TRAY) ×3 IMPLANT
PENCIL BUTTON HOLSTER BLD 10FT (ELECTRODE) ×3 IMPLANT
SPONGE LAP 4X18 X RAY DECT (DISPOSABLE) IMPLANT
SUT PDS AB 1 CT1 36 (SUTURE) ×3 IMPLANT
SUT PLAIN 0 NONE (SUTURE) ×6 IMPLANT
SUT PLAIN 2 0 (SUTURE) ×2
SUT PLAIN 3 0 X 1 18 (SUTURE) ×3 IMPLANT
SUT PLAIN ABS 2-0 54XMFL TIE (SUTURE) ×1 IMPLANT
SUT VIC AB 0 CT1 27 (SUTURE) ×2
SUT VIC AB 0 CT1 27XBRD ANBCTR (SUTURE) ×1 IMPLANT
SUT VIC AB 2-0 CT1 27 (SUTURE) ×4
SUT VIC AB 2-0 CT1 TAPERPNT 27 (SUTURE) ×2 IMPLANT
SUT VIC AB 3-0 CT1 27 (SUTURE) ×2
SUT VIC AB 3-0 CT1 TAPERPNT 27 (SUTURE) ×1 IMPLANT
SUT VIC AB 3-0 CTX 36 (SUTURE) ×3 IMPLANT
TOWEL OR 17X24 6PK STRL BLUE (TOWEL DISPOSABLE) ×6 IMPLANT
TRAY FOLEY CATH SILVER 14FR (SET/KITS/TRAYS/PACK) ×3 IMPLANT
WATER STERILE IRR 1000ML POUR (IV SOLUTION) ×3 IMPLANT

## 2014-10-28 NOTE — Transfer of Care (Signed)
Immediate Anesthesia Transfer of Care Note  Patient: Sheryl Berg  Procedure(s) Performed: Procedure(s): BILATERAL DISTAL POST PARTUM TUBAL LIGATION (N/A)  Patient Location: PACU  Anesthesia Type:Epidural  Level of Consciousness: awake, alert  and oriented  Airway & Oxygen Therapy: Patient Spontanous Breathing  Post-op Assessment: Report given to RN and Post -op Vital signs reviewed and stable  Post vital signs: Reviewed and stable  Last Vitals:  Filed Vitals:   10/28/14 0956  BP: 102/64  Pulse: 52  Temp: 36.9 C  Resp: 16    Complications: No apparent anesthesia complications

## 2014-10-28 NOTE — Lactation Note (Signed)
This note was copied from the chart of Girl Chealsey Staley. Lactation Consultation Note  Mom has visitors in room. Mother denies problems or questions regarding breastfeeding. States she knows how to hand express and has viewed colostrum. Mom encouraged to feed baby 8-12 times/24 hours and with feeding cues.  Baby has breastfed 6x since birth, 1 void, 1 stool. Mom made aware of O/P services, breastfeeding support groups, community resources, and our phone # for post-discharge questions.     Patient Name: Girl Jona Erkkila JYNWG'N Date: 10/28/2014 Reason for consult: Initial assessment   Maternal Data Has patient been taught Hand Expression?: Yes (EX mother, states she knows how.) Does the patient have breastfeeding experience prior to this delivery?: Yes  Feeding Feeding Type: Breast Fed Length of feed: 60 min  LATCH Score/Interventions Latch: Repeated attempts needed to sustain latch, nipple held in mouth throughout feeding, stimulation needed to elicit sucking reflex. Intervention(s): Assist with latch;Adjust position;Breast compression  Audible Swallowing: None Intervention(s): Hand expression  Type of Nipple: Everted at rest and after stimulation  Comfort (Breast/Nipple): Soft / non-tender     Hold (Positioning): Assistance needed to correctly position infant at breast and maintain latch.  LATCH Score: 6  Lactation Tools Discussed/Used     Consult Status Consult Status: Follow-up Date: 10/29/14 Follow-up type: In-patient    Dahlia Byes [facility] 10/28/2014, 11:16 AM

## 2014-10-28 NOTE — Anesthesia Postprocedure Evaluation (Signed)
Anesthesia Post Note  Patient: Sheryl Berg  Procedure(s) Performed: Procedure(s) (LRB): BILATERAL DISTAL POST PARTUM TUBAL LIGATION (N/A)  Anesthesia type: Epidural  Patient location: PACU  Post pain: Pain level controlled  Post assessment: Post-op Vital signs reviewed  Last Vitals:  Filed Vitals:   10/28/14 1345  BP: 110/70  Pulse: 48  Temp:   Resp: 25    Post vital signs: Reviewed  Level of consciousness: awake  Complications: No apparent anesthesia complications

## 2014-10-28 NOTE — Anesthesia Postprocedure Evaluation (Signed)
  Anesthesia Post-op Note  Patient: Sheryl Berg  Procedure(s) Performed: * No procedures listed *  Patient Location: Mother/Baby  Anesthesia Type:Epidural  Level of Consciousness: awake, alert  and oriented  Airway and Oxygen Therapy: Patient Spontanous Breathing  Post-op Pain: none  Post-op Assessment: Post-op Vital signs reviewed and Patient's Cardiovascular Status Stable              Post-op Vital Signs: Reviewed and stable  Last Vitals:  Filed Vitals:   10/28/14 0619  BP: 118/98  Pulse: 46  Temp: 37.1 C  Resp:     Complications: No apparent anesthesia complications

## 2014-10-28 NOTE — Op Note (Signed)
NAMEFERNE, Sheryl Berg NO.:  [id number]  MEDICAL RECORD NO.:  [id number]  LOCATION:  9143                          FACILITY:  [facility]  PHYSICIAN:  Malachi Pro. Ambrose Mantle, M.D. DATE OF BIRTH:  14-Mar-1990  DATE OF PROCEDURE:  10/28/2014 DATE OF DISCHARGE:                              OPERATIVE REPORT   PREOPERATIVE DIAGNOSIS:  Voluntary sterilization.  POSTOPERATIVE DIAGNOSIS:  Voluntary sterilization.  OPERATION:  Bilateral distal salpingectomy.  OPERATOR:  Malachi Pro. Ambrose Mantle, M.D.  ANESTHESIA:  Epidural anesthesia.  DESCRIPTION OF PROCEDURE:  The patient was brought to the operating room.  Epidural anesthetic was boosted and after some time, the epidural anesthetic was effective, so the urethra was prepped.  Foley catheter was inserted to straight drain.  The abdominal wall was prepped and draped as a sterile field.  A time-out was done, and the procedure was begun.  I pinched the abdomen, the patient did not feel it.  I made an incision through the inferior portion of the umbilicus through the skin and subcutaneous tissue.  The patient was so thin that the fascia was immediately present, I cut into the abdominal cavity by just cutting through the fascia.  I enlarged the incision, identified both tubes, traced them to their fimbriated ends, identified both ovaries which were normal.  I elected to do a distal salpingectomy on both sides by puncturing the mesosalpinx and then placing 2 Kelly clamps across the mesosalpinx on both tubes.  I cut away the distal salpinx and then sutured the clamps holding the mesosalpingeal tissue.  On the patient's right tube, one of the Kelly clamps came off and caused some bleeding, but we were able to secure the vessel.  All sutures were doubly ligated. There was no bleeding present.  I inspected the mesosalpinx very carefully for any bleeding.  I did use a little Bovie on the right mesosalpinx.  The field was dry.  Retractors were  removed.  Peritoneum and fascia were closed in 1 layer, with 3 interrupted figure-of-eight sutures of 0 PDS.  The patient was so thin that I actually cut the PDS sutures shorter, the wings on the suture shorter so they would stick through the abdominal skin.  I then tried to conceal them with some subcutaneous tissue, there was very little subcutaneous tissue but what there was, I tried to place over the PDS sutures, closed the skin with interrupted 3-0 plain catgut.  The patient seemed to tolerate the procedure well.  Blood loss was about 15 mL.  Sponge and needle counts were correct, and she was returned to recovery in satisfactory condition.     Malachi Pro. Ambrose Mantle, M.D.    TFH/MEDQ  D:  10/28/2014  T:  10/28/2014  Job:  [id number]

## 2014-10-28 NOTE — Anesthesia Preprocedure Evaluation (Signed)
Anesthesia Evaluation  Patient identified by MRN, date of birth, ID band Patient awake    Reviewed: Allergy & Precautions, H&P , NPO status , Patient's Chart, lab work & pertinent test results  Airway Mallampati: I  TM Distance: >3 FB Neck ROM: full    Dental no notable dental hx.    Pulmonary asthma , Current Smoker,    Pulmonary exam normal breath sounds clear to auscultation       Cardiovascular negative cardio ROS Normal cardiovascular exam Rhythm:regular Rate:Normal     Neuro/Psych negative neurological ROS  negative psych ROS   GI/Hepatic negative GI ROS, Neg liver ROS,   Endo/Other  negative endocrine ROS  Renal/GU negative Renal ROS  negative genitourinary   Musculoskeletal   Abdominal   Peds  Hematology negative hematology ROS (+)   Anesthesia Other Findings Pregnancy - 4th child, has had 3 epidurals prior Platelets and allergies reviewed Denies active cardiac or pulmonary symptoms, METS > 4  Denies blood thinning medications, bleeding disorders, hypertension, supine hypotension syndrome, previous anesthesia difficulties    Reproductive/Obstetrics negative OB ROS Desires sterilization- post partum with indwelling epidural catheter                             Anesthesia Physical  Anesthesia Plan  ASA: II  Anesthesia Plan: Epidural   Post-op Pain Management:    Induction:   Airway Management Planned: Natural Airway  Additional Equipment:   Intra-op Plan:   Post-operative Plan:   Informed Consent: I have reviewed the patients History and Physical, chart, labs and discussed the procedure including the risks, benefits and alternatives for the proposed anesthesia with the patient or authorized representative who has indicated his/her understanding and acceptance.   Dental advisory given  Plan Discussed with: Anesthesiologist, CRNA and Surgeon  Anesthesia Plan  Comments:         Anesthesia Quick Evaluation

## 2014-10-28 NOTE — Progress Notes (Signed)
Patient ID: Sheryl Berg, female   DOB: [birth year], 24 y.o.   MRN: [id number] #1 afebrile Has bradycardia but no complaints. She wants a BTL and is scheduled for 12:30 PM

## 2014-10-28 NOTE — Progress Notes (Signed)
PIV # 18 left forearm patent . Epidural catheter intact and secure. Patient is comfortable . Minimal on peripad. Wishes to sleep. Milly Jakob

## 2014-10-29 DIAGNOSIS — IMO0001 Reserved for inherently not codable concepts without codable children: Secondary | ICD-10-CM

## 2014-10-29 LAB — CBC
HEMATOCRIT: 31.4 % — AB (ref 36.0–46.0)
HEMOGLOBIN: 10.2 g/dL — AB (ref 12.0–15.0)
MCH: 28.7 pg (ref 26.0–34.0)
MCHC: 32.5 g/dL (ref 30.0–36.0)
MCV: 88.5 fL (ref 78.0–100.0)
Platelets: 133 10*3/uL — ABNORMAL LOW (ref 150–400)
RBC: 3.55 MIL/uL — AB (ref 3.87–5.11)
RDW: 14.4 % (ref 11.5–15.5)
WBC: 7.5 10*3/uL (ref 4.0–10.5)

## 2014-10-29 MED ORDER — OXYCODONE-ACETAMINOPHEN 5-325 MG PO TABS: 1.0000 | ORAL_TABLET | ORAL | Status: DC | PRN

## 2014-10-29 MED ORDER — IBUPROFEN 600 MG PO TABS: 600.0000 mg | ORAL_TABLET | Freq: Four times a day (QID) | ORAL | Status: DC

## 2014-10-29 NOTE — Anesthesia Postprocedure Evaluation (Signed)
  Anesthesia Post-op Note  Patient: Sheryl Berg  Procedure(s) Performed: Procedure(s): BILATERAL DISTAL POST PARTUM TUBAL LIGATION (N/A)  Patient Location: Mother/Baby  Anesthesia Type:Epidural  Level of Consciousness: awake, alert  and oriented  Airway and Oxygen Therapy: Patient Spontanous Breathing  Post-op Pain: none  Post-op Assessment: Post-op Vital signs reviewed and Patient's Cardiovascular Status Stable              Post-op Vital Signs: Reviewed and stable  Last Vitals:  Filed Vitals:   10/29/14 0500  BP: 103/59  Pulse: 55  Temp: 36.6 C  Resp: 20    Complications: No apparent anesthesia complications

## 2014-10-29 NOTE — Discharge Summary (Signed)
Obstetric Discharge Summary Reason for Admission: induction of labor Prenatal Procedures: NST and ultrasound Intrapartum Procedures: spontaneous vaginal delivery and tubal ligation Postpartum Procedures: P.P. tubal ligation Complications-Operative and Postpartum: 1 degree perineal laceration HEMOGLOBIN  Date Value Ref Range Status  10/29/2014 10.2* 12.0 - 15.0 g/dL Final   HCT  Date Value Ref Range Status  10/29/2014 31.4* 36.0 - 46.0 % Final    Physical Exam:  General: alert, cooperative and no distress Lochia: appropriate Uterine Fundus: firm Incision: healing well, no significant drainage DVT Evaluation: Negative Homan's sign. No significant calf/ankle edema.  Discharge Diagnoses: Term Pregnancy-delivered and s/p pp BTL  Discharge Information: Date: 10/29/2014 Activity: pelvic rest Diet: routine Medications: PNV, Ibuprofen and Percocet Condition: stable Instructions: refer to practice specific booklet Discharge to: home Follow-up Information    Follow up with Bing Plume, MD. Call in 2 weeks.   Specialty:  Obstetrics and Gynecology   Why:  For incision check and 6weeks for postpartum visit   Contact information:   [address], [address] [place] [address] [phone]       Newborn Data: Live born female  Birth Weight: 6 lb 9.6 oz (2994 g) APGAR: 9, 10  Home with mother.  Sheryl Berg 10/29/2014, 9:52 AM

## 2014-10-29 NOTE — Discharge Instructions (Signed)
Avoid driving for at least 1-2 weeks or until off narcotic pain meds.  No heavy lifting greater than 10 lbs.  Nothing in vagina for 6 weeks.  May remove bandage in 1-2 days.  Shower over incision and pat dry. ° °

## 2014-10-29 NOTE — Addendum Note (Signed)
Addendum  created 10/29/14 0837 by Elgie Congo, CRNA   Modules edited: Notes Section   Notes Section:  File: [id number]

## 2014-10-29 NOTE — Progress Notes (Signed)
Post Partum Day 2 Subjective: up ad lib, voiding, tolerating PO, + flatus and pain controleld with medications. Bonding well with baby. Breastfeeding  Objective: Blood pressure 108/59, pulse 45, temperature 98.3 F (36.8 C), temperature source Oral, resp. rate 18, height 5' 4.5" (1.638 m), weight 76.204 kg (168 lb), last menstrual period 01/17/2014, SpO2 99 %, unknown if currently breastfeeding.  Physical Exam:  General: alert, cooperative and no distress Lochia: appropriate Uterine Fundus: firm Incision: healing well, no significant drainage DVT Evaluation: Negative Homan's sign. No significant calf/ankle edema.   Recent Labs  10/28/14 1356 10/29/14 0530  HGB 11.3* 10.2*  HCT 34.5* 31.4*    Assessment/Plan: Discharge home, Breastfeeding and Contraception pp BTL   LOS: 2 days   Sheryl Berg 10/29/2014, 9:47 AM

## 2014-10-30 ENCOUNTER — Ambulatory Visit: Payer: Self-pay

## 2014-10-30 NOTE — Lactation Note (Signed)
This note was copied from the chart of Sheryl Berg. Lactation Consultation Note: Mother states that her nipples became sore with infant cluster feeding. She has breastfed and bottle fed. Mother pumped 30 ml with last pump. She states that she is active with WIC. She is now using a hand pump and is comfortable with the hand pump. She is pumping 15 mins on each breast. Mother denies need for any assistance with latch. She was advised to phone OB for APNO rx. Mother receptive to all teaching. She is aware of available LC assistance.   Patient Name: Sheryl Berg [id number] Date: 10/30/2014     Maternal Data    Feeding    LATCH Score/Interventions                      Lactation Tools Discussed/Used     Consult Status      Michel Bickers 10/30/2014, 3:13 PM

## 2014-10-31 ENCOUNTER — Encounter (HOSPITAL_COMMUNITY): Payer: Self-pay | Admitting: Obstetrics and Gynecology

## 2014-11-28 ENCOUNTER — Encounter (HOSPITAL_COMMUNITY): Payer: Self-pay | Admitting: Emergency Medicine

## 2014-11-28 ENCOUNTER — Emergency Department (HOSPITAL_COMMUNITY)
Admission: EM | Admit: 2014-11-28 | Discharge: 2014-11-29 | Disposition: A | Discharge status: Home / Self Care | Payer: Medicaid Other | Attending: Emergency Medicine | Admitting: Emergency Medicine

## 2014-11-28 DIAGNOSIS — N898 Other specified noninflammatory disorders of vagina: Secondary | ICD-10-CM | POA: Diagnosis present

## 2014-11-28 DIAGNOSIS — J45909 Unspecified asthma, uncomplicated: Secondary | ICD-10-CM | POA: Insufficient documentation

## 2014-11-28 DIAGNOSIS — Z720 Tobacco use: Secondary | ICD-10-CM | POA: Diagnosis not present

## 2014-11-28 DIAGNOSIS — Z8619 Personal history of other infectious and parasitic diseases: Secondary | ICD-10-CM | POA: Diagnosis not present

## 2014-11-28 DIAGNOSIS — R102 Pelvic and perineal pain: Secondary | ICD-10-CM | POA: Insufficient documentation

## 2014-11-28 DIAGNOSIS — N938 Other specified abnormal uterine and vaginal bleeding: Secondary | ICD-10-CM | POA: Insufficient documentation

## 2014-11-28 NOTE — ED Notes (Signed)
Pt. reports " lump" at right side of vagina with drainage onset 3 days ago , no dysuria / denies fever or chills.

## 2014-11-29 MED ORDER — HYDROCODONE-ACETAMINOPHEN 5-325 MG PO TABS: 1.0000 | ORAL_TABLET | Freq: Four times a day (QID) | ORAL | Status: DC | PRN

## 2014-11-29 MED ORDER — IBUPROFEN 200 MG PO TABS
600.0000 mg | ORAL_TABLET | Freq: Once | ORAL | Status: AC
  Administered 2014-11-29: 600 mg via ORAL
  Filled 2014-11-29: qty 3

## 2014-11-29 NOTE — ED Notes (Signed)
Pt. Left with all belongings and refused wheelchair. Discharge instructions were reviewed and all questions were answered.  

## 2014-11-29 NOTE — ED Provider Notes (Signed)
CSN: [id number]     Arrival date & time 11/28/14  1937 History   First MD Initiated Contact with Patient 11/28/14 2309     Chief Complaint  Patient presents with  . Mass     (Consider location/radiation/quality/duration/timing/severity/associated sxs/prior Treatment) HPI  This a 24 year old female who presents with vaginal pain and possible lump on her vagina. Patient reports 3-4 days of symptoms. She states that she noted increasing pain and a swelling over her labia. 2 days ago she noted a gush of what looked like possible purulent fluid. She had improvement of her pain. However, it worsened. Current pain is 8 out of 10. It is achy, and tender in nature. She's also noted vaginal bleeding. Of note, patient gave birth approximately one month ago. She sustained a mild laceration requiring suturing. She is not followed up with her OB.  Past Medical History  Diagnosis Date  . Hx of chlamydia infection   . Asthma     inhaler prn (states does not need often)   Past Surgical History  Procedure Laterality Date  . Wisdom tooth extraction    . Finger surgery      age 22 finger abcess and again in 2015  . Tubal ligation N/A 10/28/2014    Procedure: BILATERAL DISTAL POST PARTUM TUBAL LIGATION;  Surgeon: Tracey Harrieshomas Henley, MD;  Location: [facility];  Service: Gynecology;  Laterality: N/A;   No family history on file. Social History  Substance Use Topics  . Smoking status: Current Some Day Smoker  . Smokeless tobacco: Never Used  . Alcohol Use: No   OB History    Gravida Para Term Preterm AB TAB SAB Ectopic Multiple Living   5 4 4  0 1 0 1 0 0 1     Review of Systems  Constitutional: Negative for fever.  Genitourinary: Positive for vaginal bleeding and vaginal pain.  All other systems reviewed and are negative.     Allergies  Review of patient's allergies indicates no known allergies.  Home Medications   Prior to Admission medications   Medication Sig Start Date End Date Taking?  Authorizing Provider  budesonide-formoterol (SYMBICORT) 160-4.5 MCG/ACT inhaler Inhale 2 puffs into the lungs 2 (two) times daily as needed (asthma).   Yes Historical Provider, MD  Prenatal Vit-Fe Fumarate-FA (PRENATAL PO) Take 1 tablet by mouth daily.   Yes Historical Provider, MD  HYDROcodone-acetaminophen (NORCO/VICODIN) 5-325 MG tablet Take 1 tablet by mouth every 6 (six) hours as needed. 11/29/14   Shon Batonourtney F Horton, MD   BP 108/62 mmHg  Pulse 55  Temp(Src) 98.5 F (36.9 C) (Oral)  Resp 14  Ht 5' 4.5" (1.638 m)  Wt 149 lb (67.586 kg)  BMI 25.19 kg/m2  SpO2 100% Physical Exam  Constitutional: She is oriented to person, place, and time. She appears well-developed and well-nourished. No distress.  HENT:  Head: Normocephalic and atraumatic.  Cardiovascular: Normal rate and regular rhythm.   Pulmonary/Chest: Effort normal. No respiratory distress.  Genitourinary:  Moderate bleeding noted at the vaginal and shortness, tenderness to palpation at the vaginal introitus, mild fullness noted over the right inferior labia with digital exam, no active purulent drainage, suture site noted and appears intact without underlying fullness or fluctuance. Patient did not tolerate full speculum exam.  Neurological: She is alert and oriented to person, place, and time.  Skin: Skin is warm and dry.  Psychiatric: She has a normal mood and affect.  Nursing note and vitals reviewed.   ED Course  Procedures (  including critical care time) Labs Review Labs Reviewed - No data to display  Imaging Review No results found. I have personally reviewed and evaluated these images and lab results as part of my medical decision-making.   EKG Interpretation None      MDM   Final diagnoses:  Vaginal pain   Patient presents with vaginal pain. Patient did not tolerate full speculum exam. She is very tender with digital inspection. There is mild fullness suggestive of possibly a Bartholin's abscess. No  active drainage. Patient is otherwise well-appearing. She is recently postpartum and did require some suturing for a mild laceration. Given that patient did not tolerate full exam, feel she would benefit from formal OB evaluation.  Discussed with Dr. Ellyn Hack.  Patient is to call clinic tomorrow for close follow-up. In the meantime, sitz baths and pain medication. She is otherwise nontoxic.  After history, exam, and medical workup I feel the patient has been appropriately medically screened and is safe for discharge home. Pertinent diagnoses were discussed with the patient. Patient was given return precautions.     Shon Baton, MD 11/29/14 [phone]

## 2014-11-29 NOTE — Discharge Instructions (Signed)
You were seen today for vaginal pain. I suspect you may have a Bartholin's cyst or abscess.  However, given how tender you were on exam you could not be fully evaluated. You should follow-up closely with her OB/GYN for both evaluation for Bartholin's cyst as well as recheck of your sutures post partum. In the meantime, you can use warm sitz baths at home. You will be given a short course of pain medication. If you develops fever, worsening pain, or any new or worsening symptoms she should be reevaluated.

## 2014-11-30 ENCOUNTER — Encounter (HOSPITAL_COMMUNITY): Payer: Self-pay | Admitting: *Deleted

## 2014-11-30 ENCOUNTER — Emergency Department (HOSPITAL_COMMUNITY)
Admission: EM | Admit: 2014-11-30 | Discharge: 2014-11-30 | Disposition: A | Discharge status: Home / Self Care | Payer: Medicaid Other | Attending: Emergency Medicine | Admitting: Emergency Medicine

## 2014-11-30 DIAGNOSIS — Z720 Tobacco use: Secondary | ICD-10-CM | POA: Diagnosis not present

## 2014-11-30 DIAGNOSIS — R112 Nausea with vomiting, unspecified: Secondary | ICD-10-CM | POA: Diagnosis not present

## 2014-11-30 DIAGNOSIS — Z8619 Personal history of other infectious and parasitic diseases: Secondary | ICD-10-CM | POA: Insufficient documentation

## 2014-11-30 DIAGNOSIS — R1010 Upper abdominal pain, unspecified: Secondary | ICD-10-CM | POA: Insufficient documentation

## 2014-11-30 DIAGNOSIS — J45909 Unspecified asthma, uncomplicated: Secondary | ICD-10-CM | POA: Diagnosis not present

## 2014-11-30 DIAGNOSIS — R1033 Periumbilical pain: Secondary | ICD-10-CM | POA: Diagnosis present

## 2014-11-30 LAB — COMPREHENSIVE METABOLIC PANEL
ALBUMIN: 3.3 g/dL — AB (ref 3.5–5.0)
ALK PHOS: 70 U/L (ref 38–126)
ALT: 25 U/L (ref 14–54)
ANION GAP: 7 (ref 5–15)
AST: 34 U/L (ref 15–41)
BILIRUBIN TOTAL: 0.4 mg/dL (ref 0.3–1.2)
BUN: 7 mg/dL (ref 6–20)
CALCIUM: 8.8 mg/dL — AB (ref 8.9–10.3)
CO2: 28 mmol/L (ref 22–32)
Chloride: 102 mmol/L (ref 101–111)
Creatinine, Ser: 0.97 mg/dL (ref 0.44–1.00)
GFR calc Af Amer: >60 mL/min (ref >60)
GFR calc non Af Amer: >60 mL/min (ref >60)
GLUCOSE: 101 mg/dL — AB (ref 65–99)
Potassium: 3.8 mmol/L (ref 3.5–5.1)
Sodium: 137 mmol/L (ref 135–145)
TOTAL PROTEIN: 6.2 g/dL — AB (ref 6.5–8.1)

## 2014-11-30 LAB — URINALYSIS, ROUTINE W REFLEX MICROSCOPIC
BILIRUBIN URINE: NEGATIVE
Glucose, UA: NEGATIVE mg/dL
KETONES UR: 15 mg/dL — AB
Leukocytes, UA: NEGATIVE
Nitrite: NEGATIVE
PROTEIN: 30 mg/dL — AB
Specific Gravity, Urine: 1.031 — ABNORMAL HIGH (ref 1.005–1.030)
UROBILINOGEN UA: 0.2 mg/dL (ref 0.0–1.0)
pH: 8 (ref 5.0–8.0)

## 2014-11-30 LAB — CBC
HCT: 36.5 % (ref 36.0–46.0)
HEMOGLOBIN: 11.9 g/dL — AB (ref 12.0–15.0)
MCH: 29 pg (ref 26.0–34.0)
MCHC: 32.6 g/dL (ref 30.0–36.0)
MCV: 88.8 fL (ref 78.0–100.0)
PLATELETS: 169 10*3/uL (ref 150–400)
RBC: 4.11 MIL/uL (ref 3.87–5.11)
RDW: 13.4 % (ref 11.5–15.5)
WBC: 4.3 10*3/uL (ref 4.0–10.5)

## 2014-11-30 LAB — URINE MICROSCOPIC-ADD ON

## 2014-11-30 LAB — I-STAT BETA HCG BLOOD, ED (MC, WL, AP ONLY)

## 2014-11-30 LAB — LIPASE, BLOOD: Lipase: 27 U/L (ref 22–51)

## 2014-11-30 MED ORDER — ONDANSETRON 4 MG PO TBDP: ORAL_TABLET | ORAL | Status: DC

## 2014-11-30 NOTE — ED Notes (Addendum)
Pt arrives to ED Pov c/o right and left flank pain after eating pizza from [employer]. Rates it 9/10 on pain scale. Nausea with vomiting x 2.

## 2014-11-30 NOTE — ED Provider Notes (Signed)
CSN: [id number]     Arrival date & time 11/30/14  1938 History   First MD Initiated Contact with Patient 11/30/14 1956     Chief Complaint  Patient presents with  . Abdominal Pain     (Consider location/radiation/quality/duration/timing/severity/associated sxs/prior Treatment) HPI  24 year old female presents with acute bilateral side pain that started about 10 minutes after eating pizza. She states that she took a Vicodin shortly after it started but the pain progressed and worsened. Also had some periumbilical pain. Throughout twice but has not had any diarrhea. Multiple other family overs 8 this pizza and did not experience similar symptoms. The pain has progressively improved which she thinks is from the narcotic. She rates it as a mild pain, 6/10. Was mostly sharp in nature. No back pain. No urinary symptoms including no hematuria or dysuria. She was seen here last night for vaginal swelling and pain but states that while she is not sure if the swelling is down she has no pain in this area. She has OB follow-up in 2 days. No longer nauseated. Declines pain meds at this time.  Past Medical History  Diagnosis Date  . Hx of chlamydia infection   . Asthma     inhaler prn (states does not need often)   Past Surgical History  Procedure Laterality Date  . Wisdom tooth extraction    . Finger surgery      age 20 finger abcess and again in 2015  . Tubal ligation N/A 10/28/2014    Procedure: BILATERAL DISTAL POST PARTUM TUBAL LIGATION;  Surgeon: Tracey Harrieshomas Henley, MD;  Location: [facility];  Service: Gynecology;  Laterality: N/A;   No family history on file. Social History  Substance Use Topics  . Smoking status: Current Some Day Smoker  . Smokeless tobacco: Never Used  . Alcohol Use: No   OB History    Gravida Para Term Preterm AB TAB SAB Ectopic Multiple Living   5 4 4  0 1 0 1 0 0 1     Review of Systems  Constitutional: Negative for fever.  Cardiovascular: Negative for chest pain.   Gastrointestinal: Positive for nausea, vomiting and abdominal pain. Negative for diarrhea.  Genitourinary: Negative for dysuria, frequency and hematuria.  All other systems reviewed and are negative.     Allergies  Review of patient's allergies indicates no known allergies.  Home Medications   Prior to Admission medications   Medication Sig Start Date End Date Taking? Authorizing Provider  budesonide-formoterol (SYMBICORT) 160-4.5 MCG/ACT inhaler Inhale 2 puffs into the lungs 2 (two) times daily as needed (asthma).    Historical Provider, MD  HYDROcodone-acetaminophen (NORCO/VICODIN) 5-325 MG tablet Take 1 tablet by mouth every 6 (six) hours as needed. 11/29/14   Shon Batonourtney F Horton, MD  Prenatal Vit-Fe Fumarate-FA (PRENATAL PO) Take 1 tablet by mouth daily.    Historical Provider, MD   BP 109/64 mmHg  Pulse 51  Temp(Src) 98.6 F (37 C) (Oral)  Resp 20  Ht 5\' 4"  (1.626 m)  Wt 149 lb (67.586 kg)  BMI 25.56 kg/m2  SpO2 98% Physical Exam  Constitutional: She is oriented to person, place, and time. She appears well-developed and well-nourished. No distress.  HENT:  Head: Normocephalic and atraumatic.  Right Ear: External ear normal.  Left Ear: External ear normal.  Nose: Nose normal.  Eyes: Right eye exhibits no discharge. Left eye exhibits no discharge.  Cardiovascular: Normal rate, regular rhythm and normal heart sounds.   Pulmonary/Chest: Effort normal and breath sounds  normal.  Abdominal: Soft. She exhibits no distension. There is no tenderness. There is no CVA tenderness.  Neurological: She is alert and oriented to person, place, and time.  Skin: Skin is warm and dry. She is not diaphoretic.  Nursing note and vitals reviewed.   ED Course  Procedures (including critical care time) Labs Review Labs Reviewed  COMPREHENSIVE METABOLIC PANEL - Abnormal; Notable for the following:    Glucose, Bld 101 (*)    Calcium 8.8 (*)    Total Protein 6.2 (*)    Albumin 3.3 (*)     All other components within normal limits  CBC - Abnormal; Notable for the following:    Hemoglobin 11.9 (*)    All other components within normal limits  URINALYSIS, ROUTINE W REFLEX MICROSCOPIC (NOT AT [facility]) - Abnormal; Notable for the following:    Specific Gravity, Urine 1.031 (*)    Hgb urine dipstick TRACE (*)    Ketones, ur 15 (*)    Protein, ur 30 (*)    All other components within normal limits  LIPASE, BLOOD  URINE MICROSCOPIC-ADD ON  I-STAT BETA HCG BLOOD, ED ([facility], [facility], [facility] ONLY)    Imaging Review No results found. I have personally reviewed and evaluated these images and lab results as part of my medical decision-making.   EKG Interpretation None      MDM   Final diagnoses:  Upper abdominal pain  Nausea and vomiting in adult    Patient feels much better without any acute intervention here. Currently is asymptomatic upon discharge. No reproducible abdominal tenderness, most likely this is related to the pizza she ate. Labwork unremarkable. She does have trace hemoglobin in her urine but states that she had some blood after the pelvic exam yesterday. Highly doubt kidney stone. No signs of pyelonephritis. Highly doubt acute intra-abdominal emergency. Her vagina started feeling better and she does not want to be reexamined at this time. Follow-up with OB/GYN as scheduled in 2 days, and discussed strict return precautions.    Pricilla Loveless, MD 11/30/14 [phone]

## 2016-08-07 ENCOUNTER — Emergency Department (HOSPITAL_COMMUNITY)
Admission: EM | Admit: 2016-08-07 | Discharge: 2016-08-08 | Disposition: A | Discharge status: Home / Self Care | Payer: Medicaid Other | Attending: Emergency Medicine | Admitting: Emergency Medicine

## 2016-08-07 ENCOUNTER — Encounter (HOSPITAL_COMMUNITY): Payer: Self-pay | Admitting: Emergency Medicine

## 2016-08-07 DIAGNOSIS — J029 Acute pharyngitis, unspecified: Secondary | ICD-10-CM | POA: Diagnosis present

## 2016-08-07 DIAGNOSIS — J020 Streptococcal pharyngitis: Secondary | ICD-10-CM | POA: Diagnosis not present

## 2016-08-07 DIAGNOSIS — F17210 Nicotine dependence, cigarettes, uncomplicated: Secondary | ICD-10-CM | POA: Diagnosis not present

## 2016-08-07 DIAGNOSIS — J45909 Unspecified asthma, uncomplicated: Secondary | ICD-10-CM | POA: Diagnosis not present

## 2016-08-07 NOTE — ED Triage Notes (Addendum)
Pt had tongue pierced on Friday.  Reports sore throat since yesterday and R sided lymph nodes swollen on neck x 2 days.  Reports difficulty swallowing.  Pt is handling secretions without any difficulty.

## 2016-08-08 LAB — RAPID STREP SCREEN (MED CTR MEBANE ONLY): STREPTOCOCCUS, GROUP A SCREEN (DIRECT): POSITIVE — AB

## 2016-08-08 MED ORDER — PENICILLIN G BENZATHINE 1200000 UNIT/2ML IM SUSP
1.2000 10*6.[IU] | Freq: Once | INTRAMUSCULAR | Status: AC
  Administered 2016-08-08: 1.2 10*6.[IU] via INTRAMUSCULAR
  Filled 2016-08-08: qty 2

## 2016-08-08 NOTE — ED Provider Notes (Signed)
[facility]-EMERGENCY DEPT Provider Note   CSN: [id number] Arrival date & time: 08/07/16  2037     History   Chief Complaint Chief Complaint  Patient presents with  . Sore Throat    HPI Sheryl Berg is a 26 y.o. female who presents to the ED with a sore throat that started 2 days ago. She denies any other symptoms. She took Advil with mild relief.   The history is provided by the patient. No language interpreter was used.  Sore Throat  This is a new problem. The current episode started 2 days ago. The problem occurs constantly. The problem has been gradually worsening. Pertinent negatives include no chest pain, no abdominal pain, no headaches and no shortness of breath. The symptoms are aggravated by swallowing and eating. Nothing relieves the symptoms.    Past Medical History:  Diagnosis Date  . Asthma    inhaler prn (states does not need often)  . Hx of chlamydia infection     Patient Active Problem List   Diagnosis Date Noted  . Postpartum tubal ligation planned 10/29/2014  . SVD (spontaneous vaginal delivery) 10/28/2014  . Pregnancy 10/26/2014    Past Surgical History:  Procedure Laterality Date  . FINGER SURGERY     age 68 finger abcess and again in 2015  . TUBAL LIGATION N/A 10/28/2014   Procedure: BILATERAL DISTAL POST PARTUM TUBAL LIGATION;  Surgeon: Tracey Harrieshomas Henley, MD;  Location: [facility];  Service: Gynecology;  Laterality: N/A;  . WISDOM TOOTH EXTRACTION      OB History    Gravida Para Term Preterm AB Living   5 4 4  0 1 1   SAB TAB Ectopic Multiple Live Births   1 0 0 0 1       Home Medications    Prior to Admission medications   Medication Sig Start Date End Date Taking? Authorizing Provider  budesonide-formoterol (SYMBICORT) 160-4.5 MCG/ACT inhaler Inhale 2 puffs into the lungs 2 (two) times daily as needed (asthma).    [provider]  HYDROcodone-acetaminophen (NORCO/VICODIN) 5-325 MG tablet Take 1 tablet by mouth every 6 (six) hours as  needed. Patient taking differently: Take 1 tablet by mouth every 6 (six) hours as needed for moderate pain or severe pain.  11/29/14   Shon BatonHorton, Courtney F, MD  ondansetron (ZOFRAN ODT) 4 MG disintegrating tablet 4mg  ODT q4 hours prn nausea/vomit 11/30/14   Pricilla LovelessGoldston, Scott, MD  Prenatal Vit-Fe Fumarate-FA (PRENATAL PO) Take 1 tablet by mouth daily.    [provider]    Family History No family history on file.  Social History Social History  Substance Use Topics  . Smoking status: Current Some Day Smoker  . Smokeless tobacco: Never Used  . Alcohol use No     Allergies   Patient has no known allergies.   Review of Systems Review of Systems  Constitutional: Negative for chills and fever.  HENT: Positive for sore throat. Negative for congestion, ear pain, sinus pressure and trouble swallowing.   Eyes: Negative for redness and itching.  Respiratory: Negative for cough and shortness of breath.   Cardiovascular: Negative for chest pain.  Gastrointestinal: Negative for abdominal pain, nausea and vomiting.  Genitourinary: Negative for dysuria and urgency.  Musculoskeletal: Negative for back pain.  Skin: Negative for wound.  Neurological: Negative for headaches.  Hematological: Positive for adenopathy.  Psychiatric/Behavioral: The patient is not nervous/anxious.      Physical Exam Updated Vital Signs BP 108/63 (BP Location: Right Arm)   Pulse Marland Kitchen(!)  58   Temp 98.7 F (37.1 C) (Oral)   Resp 19   LMP 07/14/2016   SpO2 99%   Physical Exam  Constitutional: She is oriented to person, place, and time. She appears well-developed and well-nourished. No distress.  HENT:  Head: Normocephalic and atraumatic.  Right Ear: Tympanic membrane normal.  Left Ear: Tympanic membrane normal.  Nose: Nose normal.  Mouth/Throat: Uvula is midline and mucous membranes are normal. Posterior oropharyngeal erythema present. No tonsillar abscesses. Tonsils are 2+ on the right. Tonsils are 2+ on  the left. Tonsillar exudate.  Eyes: EOM are normal.  Neck: Neck supple.  Cardiovascular: Regular rhythm.  Bradycardia present.   Pulmonary/Chest: Effort normal and breath sounds normal.  Abdominal: Soft. Bowel sounds are normal. There is no tenderness.  Musculoskeletal: Normal range of motion.  Lymphadenopathy:    She has cervical adenopathy.  Neurological: She is alert and oriented to person, place, and time. No cranial nerve deficit.  Skin: Skin is warm and dry.  Psychiatric: She has a normal mood and affect. Her behavior is normal.  Nursing note and vitals reviewed.    ED Treatments / Results  Labs (all labs ordered are listed, but only abnormal results are displayed) Labs Reviewed  RAPID STREP SCREEN (NOT AT [facility]) - Abnormal; Notable for the following:       Result Value   Streptococcus, Group A Screen (Direct) POSITIVE (*)    All other components within normal limits    Radiology No results found.  Procedures Procedures (including critical care time)  Medications Ordered in ED Medications  penicillin g benzathine (BICILLIN LA) 1200000 UNIT/2ML injection 1.2 Million Units (not administered)     Initial Impression / Assessment and Plan / ED Course  I have reviewed the triage vital signs and the nursing notes.  Pertinent lab results that were available during my care of the patient were reviewed by me and considered in my medical decision making (see chart for details).   Final Clinical Impressions(s) / ED Diagnoses  26 y.o. female with sore throat and positive strep screen stable for d/c without difficulty swallowing, no tonsillar abscess and does not appear toxic. Treated with injection of Penicillin G 1.2 m/u prior to d/c. She will f/u with her PCP or return for any problems.   Final diagnoses:  Strep throat    New Prescriptions New Prescriptions   No medications on file     Kerrie [place], NP 08/08/16 1610    Devoria Albe, MD 08/08/16 [phone]

## 2016-08-08 NOTE — Discharge Instructions (Signed)
Take tylenol and ibuprofen as needed for pain. Use salt water gargles and Chloraseptic spray. Follow up with your doctor or return here for worsening symptoms.

## 2016-12-25 ENCOUNTER — Other Ambulatory Visit: Payer: Self-pay

## 2016-12-25 ENCOUNTER — Encounter (HOSPITAL_COMMUNITY): Payer: Self-pay

## 2016-12-25 ENCOUNTER — Emergency Department (HOSPITAL_COMMUNITY)
Admission: EM | Admit: 2016-12-25 | Discharge: 2016-12-25 | Disposition: A | Discharge status: Home / Self Care | Payer: Medicaid Other | Attending: Emergency Medicine | Admitting: Emergency Medicine

## 2016-12-25 ENCOUNTER — Emergency Department (HOSPITAL_COMMUNITY): Payer: Medicaid Other

## 2016-12-25 DIAGNOSIS — M79644 Pain in right finger(s): Secondary | ICD-10-CM | POA: Insufficient documentation

## 2016-12-25 DIAGNOSIS — F17200 Nicotine dependence, unspecified, uncomplicated: Secondary | ICD-10-CM | POA: Insufficient documentation

## 2016-12-25 DIAGNOSIS — J45909 Unspecified asthma, uncomplicated: Secondary | ICD-10-CM | POA: Diagnosis not present

## 2016-12-25 NOTE — ED Triage Notes (Signed)
Pt reports hitting right index finger on tv stand Saturday and has been having pain and swelling to tip of finger.

## 2016-12-25 NOTE — Discharge Instructions (Signed)
Please read and follow all provided instructions.  You have been seen today for finger pain  Tests performed today include: An x-ray of the affected area - does NOT show any broken bones or dislocations.  Vital signs. See below for your results today.   Home care instructions: -- *PRICE in the first 24-48 hours after injury: Protect (with brace, splint, sling), if given by your provider Rest Ice- Do not apply ice pack directly to your skin, place towel or similar between your skin and ice/ice pack. Apply ice for 20 min, then remove for 40 min while awake Compression- Wear brace, elastic bandage, splint as directed by your provider Elevate affected extremity above the level of your heart when not walking around for the first 24-48 hours   Use Ibuprofen (Motrin/Advil) 600mg  every 6 hours as needed for pain (do not exceed max dose in 24 hours, 2400mg )  Follow-up instructions: Please follow-up with your primary care provider or the provided orthopedic physician (bone specialist) if you continue to have significant pain in 1 week. In this case you may have a more severe injury that requires further care.   Return instructions:  Please return if your toes or feet are numb or tingling, appear gray or blue, or you have severe pain (also elevate the leg and loosen splint or wrap if you were given one) Please return to the Emergency Department if you experience worsening symptoms.  Please return if you have any other emergent concerns. Additional Information:  Your vital signs today were: BP 108/67 (BP Location: Right Arm)    Pulse 61    Temp 98.2 F (36.8 C) (Oral)    Resp 16    Ht 5\' 4"  (1.626 m)    Wt 65.8 kg (145 lb)    LMP 11/11/2016    SpO2 100%    BMI 24.89 kg/m  If your blood pressure (BP) was elevated above 135/85 this visit, please have this repeated by your doctor within one month. ---------------

## 2016-12-25 NOTE — ED Provider Notes (Signed)
[facility]   CSN: [id number] Arrival date & time: 12/25/16  1112     History   Chief Complaint Chief Complaint  Patient presents with  . Finger Injury    HPI Sheryl Berg is a 26 y.o. right-handed female who presents the emergency department today for right index finger pain times 5 days.  Patient states that on Saturday, 12/21/2016, Sheryl Berg banged her right index finger against a TV stand.  Sheryl Berg notes that her fake nail cracked when this happened.  Sheryl Berg has since glued the fake nail back onto her finger but is having pain and swelling around the tip of the finger.  The pain is worse with palpation and movement.  Sheryl Berg is taking over-the-counter pain medication for this without much relief.  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  Sheryl Berg denies any numbness, tingling, fever, chills, redness.  HPI  Past Medical History:  Diagnosis Date  . Asthma    inhaler prn (states does not need often)  . Hx of chlamydia infection     Patient Active Problem List   Diagnosis Date Noted  . Postpartum tubal ligation planned 10/29/2014  . SVD (spontaneous vaginal delivery) 10/28/2014  . Pregnancy 10/26/2014    Past Surgical History:  Procedure Laterality Date  . FINGER SURGERY     age 28 finger abcess and again in 2015  . WISDOM TOOTH EXTRACTION      OB History    Gravida Para Term Preterm AB Living   5 4 4  0 1 1   SAB TAB Ectopic Multiple Live Births   1 0 0 0 1       Home Medications    Prior to Admission medications   Medication Sig Start Date End Date Taking? Authorizing Provider  budesonide-formoterol (SYMBICORT) 160-4.5 MCG/ACT inhaler Inhale 2 puffs into the lungs 2 (two) times daily as needed (asthma).    [provider]  HYDROcodone-acetaminophen (NORCO/VICODIN) 5-325 MG tablet Take 1 tablet by mouth every 6 (six) hours as needed. Patient taking differently: Take 1  tablet by mouth every 6 (six) hours as needed for moderate pain or severe pain.  11/29/14   Horton, Mayer Maskerourtney F, MD  ondansetron (ZOFRAN ODT) 4 MG disintegrating tablet 4mg  ODT q4 hours prn nausea/vomit 11/30/14   Pricilla LovelessGoldston, Scott, MD  Prenatal Vit-Fe Fumarate-FA (PRENATAL PO) Take 1 tablet by mouth daily.    [provider]    Family History No family history on file.  Social History Social History   Tobacco Use  . Smoking status: Current Some Day Smoker  . Smokeless tobacco: Never Used  Substance Use Topics  . Alcohol use: No  . Drug use: No     Allergies   Patient has no known allergies.   Review of Systems Review of Systems  Constitutional: Negative for fever.  Musculoskeletal: Positive for arthralgias.  Skin: Negative for color change.  Neurological: Negative for weakness and numbness.     Physical Exam Updated Vital Signs BP 108/67 (BP Location: Right Arm)   Pulse 61   Temp 98.2 F (36.8 C) (Oral)   Resp 16   Ht 5\' 4"  (1.626 m)   Wt 65.8 kg (145 lb)   LMP 11/11/2016   SpO2 100%   BMI 24.89 kg/m   Physical Exam  Constitutional: Sheryl Berg appears well-developed and well-nourished.  HENT:  Head: Normocephalic and atraumatic.  Right Ear: External ear normal.  Left Ear: External ear normal.  Eyes: Conjunctivae are normal. Right eye exhibits no discharge. Left eye exhibits no discharge. No scleral icterus.  Pulmonary/Chest: Effort normal. No respiratory distress.  Musculoskeletal:  Right hand: No gross deformities, skin intact. Fingers appear normal. No TTP over flexor sheath. TTP over 2nd finger, distal to dip .  Unable to assess if any fingernail damage as patient has fake nails glued over top. Finger adduction/abduction intact with 5/5 strength.  Thumb opposition intact. Full active and resisted ROM to flexion/extension at wrist, MCP, PIP and DIP of all fingers. FDS/FDP intact. Radial artery 2+ with <2sec cap refill. Fingerpads soft. SILT in M/U/R  distributions. Grip 5/5 strength.   Neurological: Sheryl Berg is alert.  Skin: Skin is warm and dry. No erythema. No pallor.  Psychiatric: Sheryl Berg has a normal mood and affect.  Nursing note and vitals reviewed.    ED Treatments / Results  Labs (all labs ordered are listed, but only abnormal results are displayed) Labs Reviewed - No data to display  EKG  EKG Interpretation None       Radiology Dg Finger Index Right  Result Date: 12/25/2016 CLINICAL DATA:  Index finger pain and swelling after hitting a table. EXAM: RIGHT INDEX FINGER 2+V COMPARISON:  None. FINDINGS: There is no evidence of fracture or dislocation. Linear lucency through the distal aspect of the proximal phalanx is likely a prominent nutrient foramen. There is no evidence of arthropathy or other focal bone abnormality. Soft tissues are unremarkable. IMPRESSION: Negative. Electronically Signed   By: Obie DredgeWilliam T Derry M.D.   On: 12/25/2016 12:37    Procedures Procedures (including critical care time) SPLINT APPLICATION Date/Time: 1:29 PM Authorized by: Jacinto HalimMichael M Clemie General Consent: Verbal consent obtained. Risks and benefits: risks, benefits and alternatives were discussed Consent given by: patient Splint applied by: nurse Location details: 2nd digit, right hand Splint type: finger splint, static Supplies used: finger splint, static Post-procedure: The splinted body part was neurovascularly unchanged following the procedure. Patient tolerance: Patient tolerated the procedure well with no immediate complications.    Medications Ordered in ED Medications - No data to display   Initial Impression / Assessment and Plan / ED Course  I have reviewed the triage vital signs and the nursing notes.  Pertinent labs & imaging results that were available during my care of the patient were reviewed by me and considered in my medical decision making (see chart for details).     26 year old right-handed female presents with second  digit finger pain distal to the DIP.  Sheryl Berg injured this on Saturday after hitting it against a TV stand.  Her exam is reassuring and without evidence of infection or tendon injury.  Unable to assess nail as patient has fake nails on.  I offered to have these removed so I can evaluate but Sheryl Berg did not wish to have this done at this time.  X-rays taken in triage were negative.  Will provide the patient with finger splint for symptomatic relief and advised price therapy.  Have the patient follow-up with orthopedics versus PCP if symptoms persist.  Strict return precautions discussed.  Patient is in agreement with plan and appears safe for discharge.  Final Clinical Impressions(s) / ED Diagnoses   Final diagnoses:  Pain of finger of right hand    ED Discharge Orders    None       Princella PellegriniMaczis, Young Mulvey M, PA-C 12/25/16 1331    Doug SouJacubowitz, Sam, MD 12/25/16 [phone]

## 2016-12-25 NOTE — ED Notes (Signed)
Pt transported to Xray. 

## 2019-08-18 ENCOUNTER — Encounter (HOSPITAL_COMMUNITY): Payer: Self-pay

## 2019-08-18 ENCOUNTER — Emergency Department (HOSPITAL_COMMUNITY)
Admission: EM | Admit: 2019-08-18 | Discharge: 2019-08-19 | Disposition: A | Discharge status: Home / Self Care | Payer: Medicaid Other | Attending: Emergency Medicine | Admitting: Emergency Medicine

## 2019-08-18 DIAGNOSIS — Z720 Tobacco use: Secondary | ICD-10-CM | POA: Insufficient documentation

## 2019-08-18 DIAGNOSIS — A5901 Trichomonal vulvovaginitis: Secondary | ICD-10-CM | POA: Diagnosis not present

## 2019-08-18 DIAGNOSIS — R1031 Right lower quadrant pain: Secondary | ICD-10-CM | POA: Diagnosis present

## 2019-08-18 LAB — COMPREHENSIVE METABOLIC PANEL
ALT: 13 U/L (ref 0–44)
AST: 17 U/L (ref 15–41)
Albumin: 3.6 g/dL (ref 3.5–5.0)
Alkaline Phosphatase: 54 U/L (ref 38–126)
Anion gap: 9 (ref 5–15)
BUN: 12 mg/dL (ref 6–20)
CO2: 22 mmol/L (ref 22–32)
Calcium: 9 mg/dL (ref 8.9–10.3)
Chloride: 105 mmol/L (ref 98–111)
Creatinine, Ser: 0.8 mg/dL (ref 0.44–1.00)
GFR calc Af Amer: >60 mL/min (ref >60)
GFR calc non Af Amer: >60 mL/min (ref >60)
Glucose, Bld: 100 mg/dL — ABNORMAL HIGH (ref 70–99)
Potassium: 3.7 mmol/L (ref 3.5–5.1)
Sodium: 136 mmol/L (ref 135–145)
Total Bilirubin: 0.8 mg/dL (ref 0.3–1.2)
Total Protein: 7.1 g/dL (ref 6.5–8.1)

## 2019-08-18 LAB — URINALYSIS, ROUTINE W REFLEX MICROSCOPIC
Bilirubin Urine: NEGATIVE
Glucose, UA: NEGATIVE mg/dL
Ketones, ur: 5 mg/dL — AB
Nitrite: NEGATIVE
Protein, ur: NEGATIVE mg/dL
Specific Gravity, Urine: 1.026 (ref 1.005–1.030)
pH: 5 (ref 5.0–8.0)

## 2019-08-18 LAB — LIPASE, BLOOD: Lipase: 22 U/L (ref 11–51)

## 2019-08-18 LAB — CBC
HCT: 32.3 % — ABNORMAL LOW (ref 36.0–46.0)
Hemoglobin: 9.7 g/dL — ABNORMAL LOW (ref 12.0–15.0)
MCH: 25.1 pg — ABNORMAL LOW (ref 26.0–34.0)
MCHC: 30 g/dL (ref 30.0–36.0)
MCV: 83.7 fL (ref 80.0–100.0)
Platelets: 229 10*3/uL (ref 150–400)
RBC: 3.86 MIL/uL — ABNORMAL LOW (ref 3.87–5.11)
RDW: 19 % — ABNORMAL HIGH (ref 11.5–15.5)
WBC: 11.3 10*3/uL — ABNORMAL HIGH (ref 4.0–10.5)
nRBC: 0 % (ref 0.0–0.2)

## 2019-08-18 LAB — I-STAT BETA HCG BLOOD, ED (MC, WL, AP ONLY): I-stat hCG, quantitative: <5 m[IU]/mL (ref <5)

## 2019-08-18 MED ORDER — ACETAMINOPHEN 325 MG PO TABS
650.0000 mg | ORAL_TABLET | Freq: Once | ORAL | Status: AC
  Administered 2019-08-18: 650 mg via ORAL
  Filled 2019-08-18: qty 2

## 2019-08-18 NOTE — ED Triage Notes (Signed)
PT arrives POV for eval of spotting, vag bleeding since 7/4. Pt reports she has normal cycles, never late, no spotting. Pt also reports she has had her tubes tied for 4 years. Endorses waves of lower abd pain/cramping.

## 2019-08-19 ENCOUNTER — Other Ambulatory Visit: Payer: Self-pay

## 2019-08-19 ENCOUNTER — Emergency Department (HOSPITAL_COMMUNITY): Payer: Medicaid Other

## 2019-08-19 LAB — WET PREP, GENITAL
Clue Cells Wet Prep HPF POC: NONE SEEN
Sperm: NONE SEEN
Yeast Wet Prep HPF POC: NONE SEEN

## 2019-08-19 MED ORDER — IOHEXOL 300 MG/ML  SOLN
100.0000 mL | Freq: Once | INTRAMUSCULAR | Status: AC | PRN
  Administered 2019-08-19: 100 mL via INTRAVENOUS

## 2019-08-19 MED ORDER — ONDANSETRON HCL 4 MG/2ML IJ SOLN
4.0000 mg | Freq: Once | INTRAMUSCULAR | Status: AC
  Administered 2019-08-19: 4 mg via INTRAVENOUS
  Filled 2019-08-19: qty 2

## 2019-08-19 MED ORDER — METRONIDAZOLE 500 MG PO TABS
2000.0000 mg | ORAL_TABLET | Freq: Once | ORAL | Status: AC
  Administered 2019-08-19: 2000 mg via ORAL
  Filled 2019-08-19: qty 4

## 2019-08-19 NOTE — Discharge Instructions (Addendum)
The CT scan of your abdomen pelvis was reassuringly normal.  There is no evidence of appendicitis.  Please follow-up with the OB/GYN office for reevaluation of your vaginal bleeding and for further care.  I did have a fever I recommend taking Tylenol and ibuprofen regularly as described below.  Please monitor your symptoms.  Please drink plenty of water. Return to the emergency department for any new or concerning symptoms however we have treated the trichomonas infection and your workup is reassuringly normal today.   Please use Tylenol or ibuprofen for pain.  You may use 600 mg ibuprofen every 6 hours or 1000 mg of Tylenol every 6 hours.  You may choose to alternate between the 2.  This would be most effective.  Not to exceed 4 g of Tylenol within 24 hours.  Not to exceed 3200 mg ibuprofen 24 hours.  You have been treated in the emergency department for an infection, possibly sexually transmitted. Results of your gonorrhea and chlamydia tests are pending and you will be notified if they are positive. Please refrain from intercourse for 7 days and until all sex partners (within previous 60 days) are evaluated and/or treated as well. Please follow up with your primary care provider for continued care and further STD evaluation.  It is very important to practice safe sex and use condoms when sexually active. If your results are positive you need to notify all sexual partners so they can be treated as well. The website [email or web address].net/ can be used to send anonymous text messages or emails to alert sexual contacts. Follow up with your doctor, or OBGYN in regards to today's visit.   Gonorrhea and Chlamydia SYMPTOMS  In females, symptoms may go unnoticed. Symptoms that are more noticeable can include:  Belly (abdominal) pain.  Painful intercourse.  Watery mucous-like discharge from the vagina.  Miscarriage.  Discomfort when urinating.  Inflammation of the rectum.  Abnormal gray-green frothy  vaginal discharge  Vaginal itching and irritatio  Itching and irritation of the area outside the vagina.   Painful urination.  Bleeding after sexual intercourse.  In males, symptoms include:  Burning with urination.  Pain in the testicles.  Watery mucous-like discharge from the penis.  It can cause longstanding (chronic) pelvic pain after frequent infections.  TREATMENT  PID can cause women to not be able to have children (sterile) if left untreated or if half-treated.  It is important to finish ALL medications given to you.  This is a sexually transmitted infection. So you are also at risk for other sexually transmitted diseases, including HIV (AIDS), it is recommended that you get tested. HOME CARE INSTRUCTIONS  Warning: This infection is contagious. Do not have sex until treatment is completed. Follow up at your caregiver's office or the clinic to which you were referred. If your diagnosis (learning what is wrong) is confirmed by culture or some other method, your recent sexual contacts need treatment. Even if they are symptom free or have a negative culture or evaluation, they should be treated.  PREVENTION  Women should use sanitary pads instead of tampons for vaginal discharge.  Wipe front to back after using the toilet and avoid douching.   Practice safe sex, use condoms, have only one sex partner and be sure your sex partner is not having sex with others.  Ask your caregiver to test you for chlamydia at your regular checkups or sooner if you are having symptoms.  Ask for further information if you are pregnant.  SEEK IMMEDIATE  MEDICAL CARE IF:  You develop an oral temperature above 102 F (38.9 C), not controlled by medications or lasting more than 2 days.  You develop an increase in pain.  You develop any type of abnormal discharge.  You develop vaginal bleeding and it is not time for your period.  You develop painful intercourse.   Bacterial Vaginosis  Bacterial vaginosis (BV)  is a vaginal infection where the normal balance of bacteria in the vagina is disrupted. This is not a sexually transmitted disease and your sexual partners do NOT need to be treated. CAUSES  The cause of BV is not fully understood. BV develops when there is an increase or imbalance of harmful bacteria.  Some activities or behaviors can upset the normal balance of bacteria in the vagina and put women at increased risk including:  Having a new sex partner or multiple sex partners.  Douching.  Using an intrauterine device (IUD) for contraception.  It is not clear what role sexual activity plays in the development of BV. However, women that have never had sexual intercourse are rarely infected with BV.  Women do not get BV from toilet seats, bedding, swimming pools or from touching objects around them.   SYMPTOMS  Grey vaginal discharge.  A fish-like odor with discharge, especially after sexual intercourse.  Itching or burning of the vagina and vulva.  Burning or pain with urination.  Some women have no signs or symptoms at all.   TREATMENT  Sometimes BV will clear up without treatment.  BV may be treated with antibiotics.  BV can recur after treatment. If this happens, a second round of antibiotics will often be prescribed.  HOME CARE INSTRUCTIONS  Finish all medication as directed by your caregiver.  Do not have sex until treatment is completed.  Do NOT drink any alcoholic beverages while being treated  with Metronidazole (Flagyl). This will cause a severe reaction inducing vomiting.  RESOURCE GUIDE  Dental Problems  Patients with Medicaid: [facility] [phone] [address]                                           [phone] Phone:  [phone]                                                  Phone:  [phone]  If unable to pay or uninsured, contact:  [facility] or [facility]. to become qualified for the  adult dental clinic.  Chronic Pain Problems Contact [facility]  [phone] Patients need to be referred by their primary care doctor.  Insufficient Money for Medicine Contact [employer]:  call "211" or [facility] (947) 488-0935.  No Primary Care Doctor Call [facility]  310-545-1021 Other agencies that provide inexpensive medical care    [facility]  [phone]    [facility]  [phone]    [facility]  [phone]    [facility]  [phone]    [facility]  ([phone]    [facility]  [phone]  Psychological  Services [facility]  [phone] [facility]  [phone] [facility]   [phone] (emergency services 450-237-7025)  Substance Abuse Resources [facility]  [phone] [facility] 330-619-1740 [facility] [place] 415-076-6164 Floydene Flock [phone] Residential & Outpatient Substance Abuse Program  270-158-4201  Abuse/Neglect [facility] Child Abuse Hotline (603) 068-6476 [facility] Child Abuse Hotline 253-297-7503 (After Hours)  Emergency Shelter [facility] 8022110205  Maternity Homes Room at [facility] [place] [facility] 6175824934 [facility] [phone]  MRSA Hotline #:   321-247-8895    [facility] Resources  [facility]     [employer]                          [facility] Dept. [address] [place]                       [address]      [address]  Blondell Reveal Phone:  [phone]                                   Phone:  [phone]                 Phone:  [phone]  [facility] Phone:  ([phone]  [facility] Child Abuse Hotline (470) 058-3195 857-270-4367 (After Hours)

## 2019-08-19 NOTE — ED Provider Notes (Signed)
[facility] Provider Note   CSN: [id number] Arrival date & time: 08/18/19  1815     History Chief Complaint  Patient presents with   Abdominal Pain   Vaginal Bleeding    Sheryl Berg is a 29 y.o. female.  HPI Patient is a 29 year old female presented today with chief complaint of abdominal pain.  She states that she has had intermittent crampy abdominal pain since 7/4.  She states it is progressively worsening and states that it became severe and constant approximately 2 PM yesterday.  She states since that time it is not gone away completely.  She describes it as achy with no associated nausea or vomiting.  She states she has no appetite and states that she felt more fatigued.  She was unaware that she had a fever until she was told in triage.  Patient also endorses associated vaginal bleeding that began 7/4 as well.  She states that it has been light, spotting.  She denies any dyspareunia, vaginal pain, vaginal discharge.   She states that she is currently pain-free and feels significantly improved after Tylenol.  Denies any diarrhea or hematochezia in her stool.  She also denies any urinary symptoms, dysuria, frequency, urgency, hematuria.  Denies any chest pain or shortness of breath cough or headache.  Also denies back pain.    Past Medical History:  Diagnosis Date   Asthma    inhaler prn (states does not need often)   Hx of chlamydia infection     Patient Active Problem List   Diagnosis Date Noted   Postpartum tubal ligation planned 10/29/2014   SVD (spontaneous vaginal delivery) 10/28/2014   Pregnancy 10/26/2014    Past Surgical History:  Procedure Laterality Date   FINGER SURGERY     age 89 finger abcess and again in 2015   TUBAL LIGATION N/A 10/28/2014   Procedure: BILATERAL DISTAL POST PARTUM TUBAL LIGATION;  Surgeon: Tracey Harrieshomas Henley, MD;  Location: [facility];  Service: Gynecology;  Laterality: N/A;   WISDOM TOOTH  EXTRACTION       OB History    Gravida  5   Para  4   Term  4   Preterm  0   AB  1   Living  1     SAB  1   TAB  0   Ectopic  0   Multiple  0   Live Births  1           History reviewed. No pertinent family history.  Social History   Tobacco Use   Smoking status: Current Some Day Smoker   Smokeless tobacco: Never Used  Substance Use Topics   Alcohol use: No   Drug use: No    Home Medications Prior to Admission medications   Medication Sig Start Date End Date Taking? Authorizing Provider  HYDROcodone-acetaminophen (NORCO/VICODIN) 5-325 MG tablet Take 1 tablet by mouth every 6 (six) hours as needed. Patient not taking: Reported on 08/19/2019 11/29/14   Horton, Mayer Maskerourtney F, MD  ondansetron (ZOFRAN ODT) 4 MG disintegrating tablet 4mg  ODT q4 hours prn nausea/vomit Patient not taking: Reported on 08/19/2019 11/30/14   Pricilla LovelessGoldston, Scott, MD    Allergies    Patient has no known allergies.  Review of Systems   Review of Systems  Constitutional: Negative for chills and fever.  HENT: Negative for congestion.   Eyes: Negative for pain.  Respiratory: Negative for cough and shortness of breath.   Cardiovascular: Negative for chest pain  and leg swelling.  Gastrointestinal: Positive for abdominal pain. Negative for diarrhea and vomiting.  Genitourinary: Positive for vaginal bleeding. Negative for dysuria, vaginal discharge and vaginal pain.  Musculoskeletal: Negative for myalgias.  Skin: Negative for rash.  Neurological: Negative for dizziness and headaches.    Physical Exam Updated Vital Signs BP 93/61    Pulse 85    Temp 98.2 F (36.8 C) (Oral)    Resp 16    Ht 5\' 4"  (1.626 m)    Wt 66 kg    SpO2 100%    BMI 24.98 kg/m   Physical Exam Vitals and nursing note reviewed. Exam conducted with a chaperone present.  Constitutional:      General: She is not in acute distress. HENT:     Head: Normocephalic and atraumatic.     Nose: Nose normal.  Eyes:      General: No scleral icterus. Cardiovascular:     Rate and Rhythm: Normal rate and regular rhythm.     Pulses: Normal pulses.     Heart sounds: Normal heart sounds.  Pulmonary:     Effort: Pulmonary effort is normal. No respiratory distress.     Breath sounds: No wheezing.  Abdominal:     Palpations: Abdomen is soft.     Tenderness: There is no abdominal tenderness.     Comments: Soft nondistended abdomen.  There is right lower quadrant tenderness and wincing with palpation.  There is pain with percussion of this area.  No guarding or rebound.  No CVA tenderness bilaterally.  Genitourinary:    Comments: Vulva without lesions or abnormality Vaginal canal without abnormal discharge or lesion, scant BRB in vaginal vault Cervix not visualized. No adnexal tenderness or CMT Musculoskeletal:     Cervical back: Normal range of motion.     Right lower leg: No edema.     Left lower leg: No edema.  Skin:    General: Skin is warm and dry.     Capillary Refill: Capillary refill takes less than 2 seconds.  Neurological:     Mental Status: She is alert. Mental status is at baseline.  Psychiatric:        Mood and Affect: Mood normal.        Behavior: Behavior normal.     ED Results / Procedures / Treatments   Labs (all labs ordered are listed, but only abnormal results are displayed) Labs Reviewed  WET PREP, GENITAL - Abnormal; Notable for the following components:      Result Value   Trich, Wet Prep PRESENT (*)    WBC, Wet Prep HPF POC MANY (*)    All other components within normal limits  COMPREHENSIVE METABOLIC PANEL - Abnormal; Notable for the following components:   Glucose, Bld 100 (*)    All other components within normal limits  CBC - Abnormal; Notable for the following components:   WBC 11.3 (*)    RBC 3.86 (*)    Hemoglobin 9.7 (*)    HCT 32.3 (*)    MCH 25.1 (*)    RDW 19.0 (*)    All other components within normal limits  URINALYSIS, ROUTINE W REFLEX MICROSCOPIC -  Abnormal; Notable for the following components:   APPearance HAZY (*)    Hgb urine dipstick SMALL (*)    Ketones, ur 5 (*)    Leukocytes,Ua LARGE (*)    Bacteria, UA RARE (*)    All other components within normal limits  URINE CULTURE  LIPASE, BLOOD  I-STAT BETA HCG BLOOD, ED ([facility], [facility], [facility] ONLY)  GC/CHLAMYDIA PROBE AMP ([facility]) NOT AT [facility]    EKG None  Radiology CT ABDOMEN PELVIS W CONTRAST  Result Date: 08/19/2019 CLINICAL DATA:  Lower abdominal pain and vaginal bleeding. EXAM: CT ABDOMEN AND PELVIS WITH CONTRAST TECHNIQUE: Multidetector CT imaging of the abdomen and pelvis was performed using the standard protocol following bolus administration of intravenous contrast. CONTRAST:  OMNIPAQUE IOHEXOL 300 MG/ML  SOLN COMPARISON:  None. FINDINGS: Lower chest: No acute abnormality. Hepatobiliary: No focal liver abnormality is seen. Layering gallbladder sludge. No gallstones, gallbladder wall thickening, or biliary dilatation. Pancreas: Unremarkable. No pancreatic ductal dilatation or surrounding inflammatory changes. Spleen: Normal in size without focal abnormality. Adrenals/Urinary Tract: Adrenal glands are unremarkable. Kidneys are normal, without renal calculi, focal lesion, or hydronephrosis. Bladder is unremarkable. Stomach/Bowel: Stomach is within normal limits. Appendix appears normal (series 6, image 60). No evidence of bowel wall thickening, distention, or inflammatory changes. Vascular/Lymphatic: No significant vascular findings are present. No enlarged abdominal or pelvic lymph nodes. Reproductive: Uterus and bilateral adnexa are unremarkable. Left ovarian corpus luteum. Other: Trace free fluid in the pelvis, likely physiologic. No pneumoperitoneum. Musculoskeletal: No acute or significant osseous findings. IMPRESSION: 1. No acute intra-abdominal process. 2. Gallbladder sludge. Electronically Signed   By: Obie Dredge M.D.   On: 08/19/2019 12:46    Procedures Procedures  (including critical care time)  Medications Ordered in ED Medications  acetaminophen (TYLENOL) tablet 650 mg (650 mg Oral Given 08/18/19 1840)  metroNIDAZOLE (FLAGYL) tablet 2,000 mg (2,000 mg Oral Given 08/19/19 0932)  ondansetron (ZOFRAN) injection 4 mg (4 mg Intravenous Given 08/19/19 0932)  iohexol (OMNIPAQUE) 300 MG/ML solution 100 mL (100 mLs Intravenous Contrast Given 08/19/19 1205)    ED Course  I have reviewed the triage vital signs and the nursing notes.  Pertinent labs & imaging results that were available during my care of the patient were reviewed by me and considered in my medical decision making (see chart for details).    MDM Rules/Calculators/A&P                          Patient is 29 year old female presented today with severe lower abdominal pain.  She also has noticed some vaginal bleeding and presented with a fever.  She states that she was unaware that she had a fever prior to arrival.  Physical exam is notable for right lower quadrant tenderness to palpation.  No CMT or adnexal tenderness.  Low suspicion for intrapelvic/intravaginal pathology.  Very much doubt PID.  Because of patient's mild leukocytosis, right lower quadrant abdominal pain, fever and the severity of her pain will obtain CT abdomen pelvis with contrast to rule out appendicitis.  The causes of generalized abdominal pain include but are not limited to AAA, mesenteric ischemia, appendicitis, diverticulitis, DKA, gastritis, gastroenteritis, AMI, nephrolithiasis, pancreatitis, peritonitis, adrenal insufficiency,lead poisoning, iron toxicity, intestinal ischemia, constipation, UTI,SBO/LBO, splenic rupture, biliary disease, IBD, IBS, PUD, or hepatitis. Ectopic pregnancy, ovarian torsion, PID.  There is trichomonas on wet prep.  Will treat empirically with single dose of 2 g of Flagyl.  Low suspicion for this being the cause of patient's fever and abdominal pain however.  Chlamydia probe obtained.  Patient declined  empiric treatment at this time.  Urinalysis is not consistent with UTI and she has no symptoms of such.  CMP without any acute abnormalities.  CBC with leukocytosis and no significant/no anemia.  Lipase within normal  limits doubt pancreatitis.  I-STAT hCG is negative making ectopic pregnancy very unlikely.  Patient has had tubal ligation however.  I discussed this case with my attending physician who cosigned this note including patient's presenting symptoms, physical exam, and planned diagnostics and interventions. Attending physician stated agreement with plan or made changes to plan which were implemented.   CT scan is without any acute abnormality.  There is some sludge in the gallbladder.  Educated patient on this she states she has no RUQ pain.  Patient's fever has completely resolved with Tylenol.  She has vital signs within normal limits.  Overall benign exam.  She states that her abdominal pain has resolved with Tylenol.  We will recommend patient follow-up with OB/GYN to reevaluate her vaginal bleeding.  And will recommend close follow-up with PCP and patient was given return precautions.  Final Clinical Impression(s) / ED Diagnoses Final diagnoses:  Right lower quadrant abdominal pain  Trichomonal vaginitis    Rx / DC Orders ED Discharge Orders    None       Gailen Shelter, [place] 08/19/19 1620    Pollyann Savoy, MD 08/19/19 [phone]

## 2019-08-20 LAB — GC/CHLAMYDIA PROBE AMP (~~LOC~~) NOT AT ARMC
Chlamydia: POSITIVE — AB
Comment: NEGATIVE
Comment: NORMAL
Neisseria Gonorrhea: NEGATIVE

## 2019-08-20 LAB — URINE CULTURE

## 2019-12-13 ENCOUNTER — Encounter (HOSPITAL_COMMUNITY): Payer: Self-pay

## 2019-12-13 ENCOUNTER — Inpatient Hospital Stay (HOSPITAL_COMMUNITY)
Admission: AD | Admit: 2019-12-13 | Discharge: 2019-12-13 | Disposition: A | Discharge status: Home / Self Care | Payer: Medicaid Other | Attending: Obstetrics and Gynecology | Admitting: Obstetrics and Gynecology

## 2019-12-13 ENCOUNTER — Other Ambulatory Visit: Payer: Self-pay

## 2019-12-13 DIAGNOSIS — N760 Acute vaginitis: Secondary | ICD-10-CM | POA: Insufficient documentation

## 2019-12-13 DIAGNOSIS — Z8619 Personal history of other infectious and parasitic diseases: Secondary | ICD-10-CM | POA: Insufficient documentation

## 2019-12-13 DIAGNOSIS — Z3202 Encounter for pregnancy test, result negative: Secondary | ICD-10-CM

## 2019-12-13 DIAGNOSIS — N898 Other specified noninflammatory disorders of vagina: Secondary | ICD-10-CM | POA: Diagnosis present

## 2019-12-13 LAB — URINALYSIS, ROUTINE W REFLEX MICROSCOPIC
Bilirubin Urine: NEGATIVE
Glucose, UA: NEGATIVE mg/dL
Hgb urine dipstick: NEGATIVE
Ketones, ur: NEGATIVE mg/dL
Leukocytes,Ua: NEGATIVE
Nitrite: NEGATIVE
Protein, ur: NEGATIVE mg/dL
Specific Gravity, Urine: 1.025 (ref 1.005–1.030)
pH: 7 (ref 5.0–8.0)

## 2019-12-13 LAB — WET PREP, GENITAL
Sperm: NONE SEEN
Trich, Wet Prep: NONE SEEN
Yeast Wet Prep HPF POC: NONE SEEN

## 2019-12-13 LAB — POCT PREGNANCY, URINE: Preg Test, Ur: NEGATIVE

## 2019-12-13 MED ORDER — TRIAMCINOLONE ACETONIDE 0.5 % EX OINT: 1.0000 "application " | TOPICAL_OINTMENT | Freq: Two times a day (BID) | CUTANEOUS | 0 refills | Status: DC

## 2019-12-13 MED ORDER — FLUCONAZOLE 150 MG PO TABS
150.0000 mg | ORAL_TABLET | Freq: Every day | ORAL | 1 refills | Status: DC
Start: 2019-12-13 — End: 2020-10-15

## 2019-12-13 NOTE — ED Triage Notes (Signed)
Pt reports vaginal pain and soreness, tender to touch after using shaving cream a week ago. Denies vaginal bleeding or discharge

## 2019-12-13 NOTE — MAU Note (Signed)
Pt sent up from er, c/o vag pain; denies preg, was not tested in ED.

## 2019-12-13 NOTE — ED Provider Notes (Signed)
I was asked to evaluate the patient by triage nursing.  29 year old female with several days of vaginal pain and soreness, tender to touch after using shaving cream.  No vaginal bleeding, discharge, fevers, chills.  Vitals overall reassuring.  Abdomen is soft and nontender.  Discussed with MAU provider for transfer.  Stable for transfer at this time.    Mare Ferrari, PA-C 12/13/19 9826    Sabas Sous, MD 12/13/19 [phone]

## 2019-12-13 NOTE — MAU Note (Signed)
Used some new shaving cream last wk on her privates.  Now feels swollen and irritated.  Just started period today.

## 2019-12-13 NOTE — MAU Provider Note (Signed)
  History     CSN: [id number]  Arrival date and time: 12/13/19 9983   First Provider Initiated Contact with Patient 12/13/19 1104      Chief Complaint  Patient presents with  . Vaginal Pain   HPI Sheryl Berg is a 29 y.o. [id number] non pregnant female who presents today for vaginal irritation. Symptoms started a week ago after using shaving cream (states she normally just uses soap & water to shave). Reports burning & itching of vulva; no internal symptoms. No change in discharge. No lesions. Has not treated symptoms.     Past Medical History:  Diagnosis Date  . Asthma    inhaler prn (states does not need often)  . Hx of chlamydia infection     Past Surgical History:  Procedure Laterality Date  . FINGER SURGERY     age 58 finger abcess and again in 2015  . TUBAL LIGATION N/A 10/28/2014   Procedure: BILATERAL DISTAL POST PARTUM TUBAL LIGATION;  Surgeon: Tracey Harries, MD;  Location: [facility];  Service: Gynecology;  Laterality: N/A;  . WISDOM TOOTH EXTRACTION      Review of Systems  Constitutional: Negative.   Gastrointestinal: Negative.   Genitourinary: Positive for vaginal pain. Negative for dyspareunia, dysuria, genital sores, hematuria, vaginal bleeding and vaginal discharge.   Physical Exam   Blood pressure 113/74, pulse (!) 59, temperature 98.8 F (37.1 C), resp. rate 17, height 5' 4.5" (1.638 m), weight 77.8 kg, last menstrual period 12/13/2019, SpO2 100 %, unknown if currently breastfeeding.  Physical Exam Vitals and nursing note reviewed.  Constitutional:      General: She is not in acute distress.    Appearance: Normal appearance.  HENT:     Head: Normocephalic and atraumatic.  Pulmonary:     Effort: Pulmonary effort is normal. No respiratory distress.  Neurological:     Mental Status: She is alert.  Psychiatric:        Mood and Affect: Mood normal.        Behavior: Behavior normal.     MAU Course  Procedures Results for orders placed or  performed during the hospital encounter of 12/13/19 (from the past 24 hour(s))  Pregnancy, urine POC     Status: None   Collection Time: 12/13/19 10:26 AM  Result Value Ref Range   Preg Test, Ur NEGATIVE NEGATIVE   MDM UPT negative  Positive for trich & chlamydia in July. Will retest today. If negative - will give topical steroid to treat symptoms. Instructed patient to f/u with her PCP if symptoms don't improve in a week.   Wet prep positive clue cells but patient doesn't meet amsel criteria for BV tx.  Will rx triamcinolone. Will also send in diflucan.  GC/CT pending.  Assessment and Plan   1. Acute vulvovaginitis   -Rx triamcinolone & diflucan -GC/CT pending -F/u with PCP if symptoms don't improve by next week   Judeth Horn 12/13/2019, 11:04 AM

## 2019-12-14 LAB — GC/CHLAMYDIA PROBE AMP (~~LOC~~) NOT AT ARMC
Chlamydia: POSITIVE — AB
Comment: NEGATIVE
Comment: NORMAL
Neisseria Gonorrhea: NEGATIVE

## 2019-12-18 ENCOUNTER — Other Ambulatory Visit: Payer: Self-pay

## 2019-12-18 MED ORDER — AZITHROMYCIN 250 MG PO TABS: 1000.0000 mg | ORAL_TABLET | Freq: Once | ORAL | 0 refills | Status: AC

## 2020-02-16 ENCOUNTER — Ambulatory Visit (HOSPITAL_COMMUNITY)
Admission: EM | Admit: 2020-02-16 | Discharge: 2020-02-16 | Disposition: A | Discharge status: Home / Self Care | Payer: 59 | Attending: Internal Medicine | Admitting: Internal Medicine

## 2020-02-16 DIAGNOSIS — Z20822 Contact with and (suspected) exposure to covid-19: Secondary | ICD-10-CM | POA: Diagnosis present

## 2020-02-16 LAB — SARS CORONAVIRUS 2 (TAT 6-24 HRS): SARS Coronavirus 2: NEGATIVE

## 2020-02-16 NOTE — ED Triage Notes (Signed)
Pt in requesting covid testing after her daughter tested positive 2 weeks ago  Pt denies cough, congestion, runny nose, or other uri sxs

## 2020-10-15 ENCOUNTER — Encounter (HOSPITAL_COMMUNITY): Payer: Self-pay

## 2020-10-15 ENCOUNTER — Ambulatory Visit (HOSPITAL_COMMUNITY)
Admission: EM | Admit: 2020-10-15 | Discharge: 2020-10-15 | Disposition: A | Discharge status: Home / Self Care | Payer: Medicaid Other | Attending: Emergency Medicine | Admitting: Emergency Medicine

## 2020-10-15 DIAGNOSIS — R059 Cough, unspecified: Secondary | ICD-10-CM

## 2020-10-15 DIAGNOSIS — R6883 Chills (without fever): Secondary | ICD-10-CM

## 2020-10-15 DIAGNOSIS — J111 Influenza due to unidentified influenza virus with other respiratory manifestations: Secondary | ICD-10-CM

## 2020-10-15 DIAGNOSIS — R519 Headache, unspecified: Secondary | ICD-10-CM

## 2020-10-15 DIAGNOSIS — M791 Myalgia, unspecified site: Secondary | ICD-10-CM

## 2020-10-15 DIAGNOSIS — J029 Acute pharyngitis, unspecified: Secondary | ICD-10-CM | POA: Diagnosis not present

## 2020-10-15 LAB — POC INFLUENZA A AND B ANTIGEN (URGENT CARE ONLY)
INFLUENZA A ANTIGEN, POC: NEGATIVE
INFLUENZA B ANTIGEN, POC: NEGATIVE

## 2020-10-15 MED ORDER — IBUPROFEN 600 MG PO TABS: 600.0000 mg | ORAL_TABLET | Freq: Three times a day (TID) | ORAL | 0 refills | Status: DC | PRN

## 2020-10-15 MED ORDER — BENZONATATE 100 MG PO CAPS: 100.0000 mg | ORAL_CAPSULE | Freq: Three times a day (TID) | ORAL | 0 refills | Status: DC

## 2020-10-15 NOTE — Discharge Instructions (Addendum)
The symptoms that you reported at your visit today along with my physical assessment suggest that you have influenza or influenza-like illness.  Despite this, your rapid flu test performed in the office today was negative.  I still recommend that you continue conservative care with rest, pushing fluids, ibuprofen for fever and body aches, as well as a prescription cough suppressant to help you sleep better at night.  Please follow-up your primary care provider in the next 2 to 3 days should your symptoms not improve.

## 2020-10-15 NOTE — ED Provider Notes (Signed)
[facility]    CSN: [id number] Arrival date & time: 10/15/20  1144      History   Chief Complaint Chief Complaint  Patient presents with   Cough    HPI Sheryl Berg is a 30 y.o. female.   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  She states that her daughter had similar illness a few days ago and recovered from it after about 3 days however she and her son continue to have symptoms that been present for approximately 5 days.  Patient complains of mildly productive cough, headache, body ache, chills.  Denies known fever.  Patient states she began to feel sick the same day she got her period and is unsure how much of her symptoms are due to that instead of respiratory infection.   Cough Cough characteristics:  Productive Sputum characteristics:  Nondescript Severity:  Moderate Onset quality:  Gradual Duration:  5 days Timing:  Intermittent Progression:  Waxing and waning Chronicity:  New Smoker: yes   Context: sick contacts   Relieved by:  None tried Worsened by:  Nothing Ineffective treatments:  None tried Associated symptoms: chills, headaches, myalgias, rhinorrhea and sinus congestion   Associated symptoms: no chest pain, no diaphoresis, no ear fullness, no ear pain, no eye discharge, no fever, no rash, no shortness of breath, no sore throat, no weight loss and no wheezing    Past Medical History:  Diagnosis Date   Asthma    inhaler prn (states does not need often)   Hx of chlamydia infection     Patient Active Problem List   Diagnosis Date Noted   Postpartum tubal ligation planned 10/29/2014   SVD (spontaneous vaginal delivery) 10/28/2014   Pregnancy 10/26/2014    Past Surgical History:  Procedure Laterality Date   FINGER SURGERY     age 71 finger abcess and again in 2015   TUBAL LIGATION N/A 10/28/2014   Procedure: BILATERAL DISTAL POST PARTUM TUBAL LIGATION;  Surgeon: Tracey Harries, MD;  Location: [facility];   Service: Gynecology;  Laterality: N/A;   WISDOM TOOTH EXTRACTION      OB History     Gravida  5   Para  4   Term  4   Preterm  0   AB  1   Living  1      SAB  1   IAB  0   Ectopic  0   Multiple  0   Live Births  1            Home Medications    Prior to Admission medications   Medication Sig Start Date End Date Taking? Authorizing Provider  benzonatate (TESSALON) 100 MG capsule Take 1 capsule (100 mg total) by mouth every 8 (eight) hours. 10/15/20  Yes Theadora Rama, PA-C  ibuprofen (ADVIL) 600 MG tablet Take 1 tablet (600 mg total) by mouth every 8 (eight) hours as needed. 10/15/20  Yes Theadora Rama, PA-C    Family History Family History  Family history unknown: Yes    Social History Social History   Tobacco Use   Smoking status: Some Days   Smokeless tobacco: Never  Substance Use Topics   Alcohol use: No   Drug use: No     Allergies   Patient has no known allergies.   Review of Systems Review of Systems  Constitutional:  Positive for chills. Negative for diaphoresis, fever and weight loss.  HENT:  Positive for  rhinorrhea. Negative for ear pain and sore throat.   Eyes:  Negative for discharge.  Respiratory:  Positive for cough. Negative for shortness of breath and wheezing.   Cardiovascular:  Negative for chest pain.  Musculoskeletal:  Positive for myalgias.  Skin:  Negative for rash.  Neurological:  Positive for headaches.    Physical Exam Triage Vital Signs ED Triage Vitals [10/15/20 1251]  Enc Vitals Group     BP 109/65     Pulse Rate 65     Resp 18     Temp 98.2 F (36.8 C)     Temp Source Oral     SpO2 98 %     Weight      Height      Head Circumference      Peak Flow      Pain Score 0     Pain Loc      Pain Edu?      Excl. in GC?    No data found.  Updated Vital Signs BP 109/65 (BP Location: Right Arm)   Pulse 65   Temp 98.2 F (36.8 C) (Oral)   Resp 18   LMP 10/13/2020   SpO2 98%   Visual  Acuity Right Eye Distance:   Left Eye Distance:   Bilateral Distance:    Right Eye Near:   Left Eye Near:    Bilateral Near:     Physical Exam Constitutional:      Appearance: Normal appearance.  HENT:     Head: Normocephalic and atraumatic.     Right Ear: Tympanic membrane, ear canal and external ear normal.     Left Ear: Tympanic membrane, ear canal and external ear normal.     Mouth/Throat:     Mouth: Mucous membranes are dry.     Pharynx: Posterior oropharyngeal erythema (Mild) present.  Eyes:     General:        Right eye: No discharge.        Left eye: No discharge.     Extraocular Movements: Extraocular movements intact.     Conjunctiva/sclera: Conjunctivae normal.     Pupils: Pupils are equal, round, and reactive to light.  Cardiovascular:     Rate and Rhythm: Normal rate and regular rhythm.     Heart sounds: No murmur heard. Pulmonary:     Effort: Pulmonary effort is normal. No respiratory distress.     Breath sounds: Normal breath sounds. No wheezing, rhonchi or rales.  Chest:     Chest wall: No tenderness.  Musculoskeletal:     Cervical back: Normal range of motion and neck supple.  Lymphadenopathy:     Cervical: Cervical adenopathy present.  Skin:    General: Skin is warm and dry.  Neurological:     General: No focal deficit present.     Mental Status: She is alert and oriented to person, place, and time.  Psychiatric:        Mood and Affect: Mood normal.        Behavior: Behavior normal.        Thought Content: Thought content normal.        Judgment: Judgment normal.     UC Treatments / Results  Labs (all labs ordered are listed, but only abnormal results are displayed) Labs Reviewed  POC INFLUENZA A AND B ANTIGEN (URGENT CARE ONLY)    EKG   Radiology No results found.  Procedures Procedures (including critical care time)  Medications Ordered in UC Medications -  No data to display  Initial Impression / Assessment and Plan / UC Course   I have reviewed the triage vital signs and the nursing notes.  Pertinent labs & imaging results that were available during my care of the patient were reviewed by me and considered in my medical decision making (see chart for details).     Rapid flu test today is negative.  Recommend patient continue to quarantine and rest until feeling better.  Conservative care recommended.  Patient provided with prescription for ibuprofen and Tessalon Perles for cough.  Patient given return precautions including worsening cough, fever over 100.2, nausea vomiting diarrhea, altered mental status. Final Clinical Impressions(s) / UC Diagnoses   Final diagnoses:  Influenza-like illness     Discharge Instructions      The symptoms she reported at your visit today along with my physical assessment just that you possibly have influenza or influenza-like illness.  Despite this, your rapid flu test performed in the office today was negative.  I still recommend that you continue conservative care with rest, pushing fluids, ibuprofen for fever and body aches, as well as a prescription cough suppressant to help you sleep better at night.  Please follow-up your primary care provider in the next 2 to 3 days should your symptoms not improve.   ED Prescriptions     Medication Sig Dispense Auth. Provider   benzonatate (TESSALON) 100 MG capsule Take 1 capsule (100 mg total) by mouth every 8 (eight) hours. 21 capsule Theadora Rama, PA-C   ibuprofen (ADVIL) 600 MG tablet Take 1 tablet (600 mg total) by mouth every 8 (eight) hours as needed. 30 tablet Theadora Rama, PA-C      PDMP not reviewed this encounter.   Theadora Rama, PA-C 10/15/20 1433

## 2020-10-15 NOTE — ED Triage Notes (Signed)
Pt presents with non productive cough X 5 days.

## 2021-03-30 ENCOUNTER — Encounter (HOSPITAL_COMMUNITY): Payer: Self-pay

## 2021-03-30 ENCOUNTER — Ambulatory Visit (HOSPITAL_COMMUNITY)
Admission: EM | Admit: 2021-03-30 | Discharge: 2021-03-30 | Disposition: A | Discharge status: Home / Self Care | Payer: Medicaid Other | Attending: Internal Medicine | Admitting: Internal Medicine

## 2021-03-30 DIAGNOSIS — L03012 Cellulitis of left finger: Secondary | ICD-10-CM

## 2021-03-30 MED ORDER — AMOXICILLIN-POT CLAVULANATE 875-125 MG PO TABS: 1.0000 | ORAL_TABLET | Freq: Two times a day (BID) | ORAL | 0 refills | Status: AC

## 2021-03-30 NOTE — Discharge Instructions (Signed)
Soak your finger in warm salt water 3 times daily Take ibuprofen as needed for pain Complete course of antibiotics Return to urgent care if you have any worsening symptoms.

## 2021-03-30 NOTE — ED Triage Notes (Signed)
Pt reports bump on left index finger x 2 days.

## 2021-04-02 DIAGNOSIS — L03012 Cellulitis of left finger: Secondary | ICD-10-CM | POA: Diagnosis not present

## 2021-04-02 NOTE — ED Provider Notes (Signed)
[place]    CSN: [id number] Arrival date & time: 03/30/21  1911      History   Chief Complaint Chief Complaint  Patient presents with   Finger Injury    HPI Sheryl Berg is a 31 y.o. female comes to urgent care with painful swelling involving the left ring finger.  Symptoms started a couple of days ago and has been persistent.  Pain is described as throbbing, severe and aggravated by palpation or movement of the distal phalanx of the left index finger.  No trauma to the finger.  Patient has had paronychia of the right finger in the past.  HPI  Past Medical History:  Diagnosis Date   Asthma    inhaler prn (states does not need often)   Hx of chlamydia infection     Patient Active Problem List   Diagnosis Date Noted   Postpartum tubal ligation planned 10/29/2014   SVD (spontaneous vaginal delivery) 10/28/2014   Pregnancy 10/26/2014    Past Surgical History:  Procedure Laterality Date   FINGER SURGERY     age 67 finger abcess and again in 2015   TUBAL LIGATION N/A 10/28/2014   Procedure: BILATERAL DISTAL POST PARTUM TUBAL LIGATION;  Surgeon: Newton Pigg, MD;  Location: [facility];  Service: Gynecology;  Laterality: N/A;   WISDOM TOOTH EXTRACTION      OB History     Gravida  5   Para  4   Term  4   Preterm  0   AB  1   Living  1      SAB  1   IAB  0   Ectopic  0   Multiple  0   Live Births  1            Home Medications    Prior to Admission medications   Medication Sig Start Date End Date Taking? Authorizing Provider  amoxicillin-clavulanate (AUGMENTIN) 875-125 MG tablet Take 1 tablet by mouth every 12 (twelve) hours for 5 days. 03/30/21 04/04/21 Yes Ellakate Gonsalves, Myrene Galas, MD  benzonatate (TESSALON) 100 MG capsule Take 1 capsule (100 mg total) by mouth every 8 (eight) hours. 10/15/20   Lynden Oxford Scales, PA-C  ibuprofen (ADVIL) 600 MG tablet Take 1 tablet (600 mg total) by mouth every 8 (eight) hours as needed. 10/15/20    Lynden Oxford Scales, PA-C    Family History Family History  Family history unknown: Yes    Social History Social History   Tobacco Use   Smoking status: Some Days   Smokeless tobacco: Never  Substance Use Topics   Alcohol use: No   Drug use: No     Allergies   Patient has no known allergies.   Review of Systems Review of Systems  Gastrointestinal: Negative.   Skin:  Positive for color change and wound. Negative for rash.    Physical Exam Triage Vital Signs ED Triage Vitals  Enc Vitals Group     BP 03/30/21 1941 106/88     Pulse Rate 03/30/21 1943 78     Resp 03/30/21 1941 16     Temp 03/30/21 1943 98.3 F (36.8 C)     Temp Source 03/30/21 1943 Oral     SpO2 03/30/21 1941 100 %     Weight --      Height --      Head Circumference --      Peak Flow --      Pain Score 03/30/21 1944 6  Pain Loc --      Pain Edu? --      Excl. in [place]? --    No data found.  Updated Vital Signs BP 106/88 (BP Location: Left Arm)    Pulse 78    Temp 98.3 F (36.8 C) (Oral)    Resp 16    LMP 03/24/2021 (Approximate)    SpO2 100%   Visual Acuity Right Eye Distance:   Left Eye Distance:   Bilateral Distance:    Right Eye Near:   Left Eye Near:    Bilateral Near:     Physical Exam Vitals and nursing note reviewed.  Musculoskeletal:     Comments: Paronychia involving the left index finger.  Skin:    General: Skin is warm.     Capillary Refill: Capillary refill takes less than 2 seconds.  Neurological:     General: No focal deficit present.     Mental Status: She is oriented to person, place, and time.     UC Treatments / Results  Labs (all labs ordered are listed, but only abnormal results are displayed) Labs Reviewed - No data to display  EKG   Radiology No results found.  Procedures Incision and Drainage  Date/Time: 04/02/2021 5:50 PM Performed by: Merrilee Jansky, MD Authorized by: Merrilee Jansky, MD   Consent:    Consent obtained:   Verbal   Consent given by:  Patient   Risks discussed:  Bleeding and incomplete drainage Universal protocol:    Patient identity confirmed:  Verbally with patient Location:    Type:  Abscess   Location:  Upper extremity   Upper extremity location:  Finger   Finger location:  L index finger Pre-procedure details:    Skin preparation:  Povidone-iodine Anesthesia:    Anesthesia method:  Topical application Procedure type:    Complexity:  Simple Procedure details:    Incision types:  Stab incision   Incision depth:  Submucosal   Drainage:  Bloody and purulent   Drainage amount:  Moderate   Wound treatment:  Wound left open   Packing materials:  None Post-procedure details:    Procedure completion:  Tolerated well, no immediate complications (including critical care time)  Medications Ordered in UC Medications - No data to display  Initial Impression / Assessment and Plan / UC Course  I have reviewed the triage vital signs and the nursing notes.  Pertinent labs & imaging results that were available during my care of the patient were reviewed by me and considered in my medical decision making (see chart for details).     1.  Paronychia of finger of the left hand: Incision and drainage completed with an 18-gauge needle Augmentin twice daily for 7 days Ibuprofen as needed for pain Soak fingers in warm salt water Return to urgent care if symptoms worsen. Final Clinical Impressions(s) / UC Diagnoses   Final diagnoses:  Paronychia of finger of left hand     Discharge Instructions      Soak your finger in warm salt water 3 times daily Take ibuprofen as needed for pain Complete course of antibiotics Return to urgent care if you have any worsening symptoms.    ED Prescriptions     Medication Sig Dispense Auth. Provider   amoxicillin-clavulanate (AUGMENTIN) 875-125 MG tablet Take 1 tablet by mouth every 12 (twelve) hours for 5 days. 10 tablet Sheryl Berg, Britta Mccreedy, MD       PDMP not reviewed this encounter.   Demaris Callander  O, MD 04/02/21 [id number]

## 2021-08-05 ENCOUNTER — Emergency Department (HOSPITAL_COMMUNITY)
Admission: EM | Admit: 2021-08-05 | Discharge: 2021-08-06 | Disposition: A | Discharge status: Home / Self Care | Payer: Medicaid Other | Attending: Emergency Medicine | Admitting: Emergency Medicine

## 2021-08-05 ENCOUNTER — Encounter (HOSPITAL_COMMUNITY): Payer: Self-pay | Admitting: Emergency Medicine

## 2021-08-05 ENCOUNTER — Emergency Department (HOSPITAL_COMMUNITY): Payer: Medicaid Other

## 2021-08-05 DIAGNOSIS — E876 Hypokalemia: Secondary | ICD-10-CM | POA: Insufficient documentation

## 2021-08-05 DIAGNOSIS — J45909 Unspecified asthma, uncomplicated: Secondary | ICD-10-CM | POA: Insufficient documentation

## 2021-08-05 DIAGNOSIS — J45901 Unspecified asthma with (acute) exacerbation: Secondary | ICD-10-CM

## 2021-08-05 DIAGNOSIS — R7309 Other abnormal glucose: Secondary | ICD-10-CM | POA: Diagnosis not present

## 2021-08-05 DIAGNOSIS — D649 Anemia, unspecified: Secondary | ICD-10-CM | POA: Insufficient documentation

## 2021-08-05 DIAGNOSIS — R0602 Shortness of breath: Secondary | ICD-10-CM | POA: Diagnosis present

## 2021-08-05 LAB — CBC
HCT: 34.7 % — ABNORMAL LOW (ref 36.0–46.0)
Hemoglobin: 11.2 g/dL — ABNORMAL LOW (ref 12.0–15.0)
MCH: 28.4 pg (ref 26.0–34.0)
MCHC: 32.3 g/dL (ref 30.0–36.0)
MCV: 88.1 fL (ref 80.0–100.0)
Platelets: 235 10*3/uL (ref 150–400)
RBC: 3.94 MIL/uL (ref 3.87–5.11)
RDW: 14.8 % (ref 11.5–15.5)
WBC: 5.4 10*3/uL (ref 4.0–10.5)
nRBC: 0 % (ref 0.0–0.2)

## 2021-08-05 LAB — BASIC METABOLIC PANEL
Anion gap: 8 (ref 5–15)
BUN: 9 mg/dL (ref 6–20)
CO2: 21 mmol/L — ABNORMAL LOW (ref 22–32)
Calcium: 8.9 mg/dL (ref 8.9–10.3)
Chloride: 106 mmol/L (ref 98–111)
Creatinine, Ser: 0.89 mg/dL (ref 0.44–1.00)
GFR, Estimated: >60 mL/min (ref >60)
Glucose, Bld: 185 mg/dL — ABNORMAL HIGH (ref 70–99)
Potassium: 3.4 mmol/L — ABNORMAL LOW (ref 3.5–5.1)
Sodium: 135 mmol/L (ref 135–145)

## 2021-08-05 LAB — TROPONIN I (HIGH SENSITIVITY): Troponin I (High Sensitivity): <2 ng/L (ref <18)

## 2021-08-05 LAB — I-STAT BETA HCG BLOOD, ED (MC, WL, AP ONLY): I-stat hCG, quantitative: <5 m[IU]/mL (ref <5)

## 2021-08-06 LAB — TROPONIN I (HIGH SENSITIVITY): Troponin I (High Sensitivity): <2 ng/L (ref <18)

## 2021-08-06 MED ORDER — ALBUTEROL SULFATE HFA 108 (90 BASE) MCG/ACT IN AERS
2.0000 | INHALATION_SPRAY | RESPIRATORY_TRACT | Status: DC | PRN
  Filled 2021-08-06: qty 6.7

## 2021-08-06 MED ORDER — ALBUTEROL SULFATE HFA 108 (90 BASE) MCG/ACT IN AERS: 2.0000 | INHALATION_SPRAY | RESPIRATORY_TRACT | 0 refills | Status: DC | PRN

## 2021-08-06 MED ORDER — PREDNISONE 20 MG PO TABS: ORAL_TABLET | ORAL | 0 refills | Status: DC

## 2021-08-06 MED ORDER — IPRATROPIUM-ALBUTEROL 0.5-2.5 (3) MG/3ML IN SOLN
3.0000 mL | Freq: Once | RESPIRATORY_TRACT | Status: AC
  Administered 2021-08-06: 3 mL via RESPIRATORY_TRACT
  Filled 2021-08-06: qty 3

## 2021-08-06 MED ORDER — POTASSIUM CHLORIDE CRYS ER 20 MEQ PO TBCR
40.0000 meq | EXTENDED_RELEASE_TABLET | Freq: Once | ORAL | Status: AC
  Administered 2021-08-06: 40 meq via ORAL
  Filled 2021-08-06: qty 2

## 2021-08-06 MED ORDER — PREDNISONE 20 MG PO TABS
60.0000 mg | ORAL_TABLET | Freq: Once | ORAL | Status: AC
  Administered 2021-08-06: 60 mg via ORAL
  Filled 2021-08-06: qty 3

## 2021-10-06 ENCOUNTER — Emergency Department (HOSPITAL_COMMUNITY): Payer: Medicaid Other

## 2021-10-06 ENCOUNTER — Other Ambulatory Visit: Payer: Self-pay

## 2021-10-06 ENCOUNTER — Encounter (HOSPITAL_COMMUNITY): Payer: Self-pay | Admitting: *Deleted

## 2021-10-06 ENCOUNTER — Emergency Department (HOSPITAL_COMMUNITY)
Admission: EM | Admit: 2021-10-06 | Discharge: 2021-10-06 | Disposition: A | Discharge status: Home / Self Care | Payer: Medicaid Other | Attending: Emergency Medicine | Admitting: Emergency Medicine

## 2021-10-06 DIAGNOSIS — Z20822 Contact with and (suspected) exposure to covid-19: Secondary | ICD-10-CM | POA: Diagnosis not present

## 2021-10-06 DIAGNOSIS — R0602 Shortness of breath: Secondary | ICD-10-CM | POA: Insufficient documentation

## 2021-10-06 DIAGNOSIS — J4521 Mild intermittent asthma with (acute) exacerbation: Secondary | ICD-10-CM | POA: Insufficient documentation

## 2021-10-06 DIAGNOSIS — Z7951 Long term (current) use of inhaled steroids: Secondary | ICD-10-CM | POA: Insufficient documentation

## 2021-10-06 DIAGNOSIS — R059 Cough, unspecified: Secondary | ICD-10-CM | POA: Diagnosis present

## 2021-10-06 LAB — CBC WITH DIFFERENTIAL/PLATELET
Abs Immature Granulocytes: 0.03 10*3/uL (ref 0.00–0.07)
Basophils Absolute: 0.1 10*3/uL (ref 0.0–0.1)
Basophils Relative: 1 %
Eosinophils Absolute: 0.7 10*3/uL — ABNORMAL HIGH (ref 0.0–0.5)
Eosinophils Relative: 9 %
HCT: 35.5 % — ABNORMAL LOW (ref 36.0–46.0)
Hemoglobin: 11.2 g/dL — ABNORMAL LOW (ref 12.0–15.0)
Immature Granulocytes: 0 %
Lymphocytes Relative: 24 %
Lymphs Abs: 1.8 10*3/uL (ref 0.7–4.0)
MCH: 27.6 pg (ref 26.0–34.0)
MCHC: 31.5 g/dL (ref 30.0–36.0)
MCV: 87.4 fL (ref 80.0–100.0)
Monocytes Absolute: 0.5 10*3/uL (ref 0.1–1.0)
Monocytes Relative: 6 %
Neutro Abs: 4.5 10*3/uL (ref 1.7–7.7)
Neutrophils Relative %: 60 %
Platelets: 256 10*3/uL (ref 150–400)
RBC: 4.06 MIL/uL (ref 3.87–5.11)
RDW: 15.8 % — ABNORMAL HIGH (ref 11.5–15.5)
WBC: 7.5 10*3/uL (ref 4.0–10.5)
nRBC: 0 % (ref 0.0–0.2)

## 2021-10-06 LAB — BASIC METABOLIC PANEL
Anion gap: 8 (ref 5–15)
BUN: 7 mg/dL (ref 6–20)
CO2: 23 mmol/L (ref 22–32)
Calcium: 8.9 mg/dL (ref 8.9–10.3)
Chloride: 109 mmol/L (ref 98–111)
Creatinine, Ser: 0.79 mg/dL (ref 0.44–1.00)
GFR, Estimated: >60 mL/min (ref >60)
Glucose, Bld: 113 mg/dL — ABNORMAL HIGH (ref 70–99)
Potassium: 3.1 mmol/L — ABNORMAL LOW (ref 3.5–5.1)
Sodium: 140 mmol/L (ref 135–145)

## 2021-10-06 LAB — I-STAT BETA HCG BLOOD, ED (MC, WL, AP ONLY): I-stat hCG, quantitative: <5 m[IU]/mL (ref <5)

## 2021-10-06 LAB — RESP PANEL BY RT-PCR (FLU A&B, COVID) ARPGX2
Influenza A by PCR: NEGATIVE
Influenza B by PCR: NEGATIVE
SARS Coronavirus 2 by RT PCR: NEGATIVE

## 2021-10-06 MED ORDER — IPRATROPIUM-ALBUTEROL 0.5-2.5 (3) MG/3ML IN SOLN
3.0000 mL | Freq: Once | RESPIRATORY_TRACT | Status: AC
  Administered 2021-10-06: 3 mL via RESPIRATORY_TRACT
  Filled 2021-10-06: qty 3

## 2021-10-06 MED ORDER — PREDNISONE 20 MG PO TABS: 40.0000 mg | ORAL_TABLET | Freq: Every day | ORAL | 0 refills | Status: AC

## 2021-10-06 MED ORDER — VENTOLIN HFA 108 (90 BASE) MCG/ACT IN AERS: 1.0000 | INHALATION_SPRAY | RESPIRATORY_TRACT | 0 refills | Status: AC | PRN

## 2021-10-06 MED ORDER — PREDNISONE 20 MG PO TABS
60.0000 mg | ORAL_TABLET | Freq: Once | ORAL | Status: AC
  Administered 2021-10-06: 60 mg via ORAL
  Filled 2021-10-06: qty 3

## 2021-10-06 NOTE — Discharge Instructions (Signed)
You were seen in the emergency department today for an asthma exacerbation.  I am placing you on prednisone over the next 4 days.  You can start this tomorrow as we gave you steroids here in the emergency department.  I have also refilled your albuterol inhaler prescription.  Please use this as needed for wheezing.  Please avoid triggers as you are able to.  Please return to emergency department if you have significantly worsening shortness of breath or wheezing is not controlled by her medications.

## 2021-10-06 NOTE — ED Triage Notes (Signed)
Cough and shortness of breath. Using inhalers without relief

## 2021-10-06 NOTE — ED Notes (Signed)
Patient verbalizes understanding of discharge instructions. Opportunity for questioning and answers were provided. Pt discharged from ED. 

## 2021-10-06 NOTE — ED Provider Notes (Signed)
[facility] EMERGENCY DEPARTMENT Provider Note   CSN: [id number] Arrival date & time: 10/06/21  0350     History  Chief Complaint  Patient presents with   Cough    Sheryl Berg is a 31 y.o. female.  With past medical history of asthma who presents to the emergency department with cough.  States beginning last night around 12 AM-1 AM she began having coughing and wheezing.  States that she was just laying in bed when this started.  She states that overnight and into this morning she used her albuterol inhaler 3 or 4 times with out relief of symptoms.  She states that she has been having cough with mucus production.  Continues to feel short of breath.  States that now she feels like her chest is tight and her sides are sore from coughing.  Denies palpitations.  She denies fevers at home, rhinorrhea, body aches or congestion.  She denies sick contacts.   Cough Associated symptoms: chest pain and shortness of breath   Associated symptoms: no fever        Home Medications Prior to Admission medications   Medication Sig Start Date End Date Taking? Authorizing Provider  albuterol (VENTOLIN HFA) 108 (90 Base) MCG/ACT inhaler Inhale 2 puffs into the lungs every 4 (four) hours as needed for wheezing or shortness of breath. 08/06/21   Dione Booze, MD  predniSONE (DELTASONE) 20 MG tablet 2 tabs po daily x 4 days 08/06/21   Dione Booze, MD      Allergies    Patient has no known allergies.    Review of Systems   Review of Systems  Constitutional:  Negative for fever.  Respiratory:  Positive for cough, chest tightness and shortness of breath.   Cardiovascular:  Positive for chest pain. Negative for palpitations.  Gastrointestinal:  Negative for nausea.  All other systems reviewed and are negative.   Physical Exam Updated Vital Signs BP 105/60   Pulse 65   Temp 99.1 F (37.3 C) (Oral)   Resp 12   SpO2 100%  Physical Exam Vitals and nursing note reviewed.   Constitutional:      General: She is not in acute distress.    Appearance: Normal appearance. She is ill-appearing. She is not toxic-appearing.  HENT:     Head: Normocephalic and atraumatic.     Nose: Congestion present.     Mouth/Throat:     Mouth: Mucous membranes are moist.     Pharynx: Oropharynx is clear.  Eyes:     General: No scleral icterus.    Extraocular Movements: Extraocular movements intact.     Pupils: Pupils are equal, round, and reactive to light.  Cardiovascular:     Rate and Rhythm: Normal rate and regular rhythm.     Pulses: Normal pulses.     Heart sounds: No murmur heard. Pulmonary:     Effort: Pulmonary effort is normal. No respiratory distress.     Breath sounds: Examination of the right-upper field reveals wheezing. Examination of the right-lower field reveals wheezing. Wheezing present.  Abdominal:     General: Bowel sounds are normal.     Palpations: Abdomen is soft.  Musculoskeletal:        General: Normal range of motion.     Cervical back: Neck supple.  Skin:    General: Skin is warm and dry.     Capillary Refill: Capillary refill takes less than 2 seconds.  Neurological:     General: No focal  deficit present.     Mental Status: She is alert and oriented to person, place, and time. Mental status is at baseline.  Psychiatric:        Mood and Affect: Mood normal.        Behavior: Behavior normal.    ED Results / Procedures / Treatments   Labs (all labs ordered are listed, but only abnormal results are displayed) Labs Reviewed  BASIC METABOLIC PANEL - Abnormal; Notable for the following components:      Result Value   Potassium 3.1 (*)    Glucose, Bld 113 (*)    All other components within normal limits  CBC WITH DIFFERENTIAL/PLATELET - Abnormal; Notable for the following components:   Hemoglobin 11.2 (*)    HCT 35.5 (*)    RDW 15.8 (*)    Eosinophils Absolute 0.7 (*)    All other components within normal limits  RESP PANEL BY RT-PCR  (FLU A&B, COVID) ARPGX2  I-STAT BETA HCG BLOOD, ED ([facility], [facility], [facility] ONLY)   EKG EKG Interpretation  Date/Time:  Saturday October 06 2021 04:06:21 EDT Ventricular Rate:  75 PR Interval:  136 QRS Duration: 84 QT Interval:  386 QTC Calculation: 431 R Axis:   55 Text Interpretation: Normal sinus rhythm with sinus arrhythmia Normal ECG When compared with ECG of 05-Aug-2021 21:58, PREVIOUS ECG IS PRESENT Confirmed by Blane Ohara [phone]) on 10/06/2021 8:33:15 AM  Radiology DG Chest 2 View  Result Date: 10/06/2021 CLINICAL DATA:  Shortness of breath and cough EXAM: CHEST - 2 VIEW COMPARISON:  08/05/2021 FINDINGS: Normal heart size and mediastinal contours. No acute infiltrate or edema. No effusion or pneumothorax. No acute osseous findings. IMPRESSION: No active cardiopulmonary disease. Electronically Signed   By: Tiburcio Pea M.D.   On: 10/06/2021 05:11    Procedures Procedures   Medications Ordered in ED Medications  ipratropium-albuterol (DUONEB) 0.5-2.5 (3) MG/3ML nebulizer solution 3 mL (3 mLs Nebulization Given 10/06/21 0413)  ipratropium-albuterol (DUONEB) 0.5-2.5 (3) MG/3ML nebulizer solution 3 mL (3 mLs Nebulization Given 10/06/21 1117)  predniSONE (DELTASONE) tablet 60 mg (60 mg Oral Given 10/06/21 1117)   ED Course/ Medical Decision Making/ A&P                           Medical Decision Making Amount and/or Complexity of Data Reviewed Labs: ordered. Radiology: ordered.  Risk Prescription drug management.   This patient presents to the ED with chief complaint(s) of cough with pertinent past medical history of asthma which further complicates the presenting complaint. The complaint involves an extensive differential diagnosis and also carries with it a high risk of complications and morbidity.    The differential diagnosis includes asthma exacerbation, COPD, viral upper respiratory infection with cough, medication side effect, congestive heart failure pneumonia, GERD,  postnasal  Additional history obtained: Additional history obtained from  none available Records reviewed Care Everywhere/External Records most recent ED provider note for cough and asthma exacerbation  ED Course and Reassessment: 32 year old female with past medical history of asthma who presents to emergency department with wheezing and cough. On my initial exam she does have wheezing primarily on the right.  She is otherwise in not acute respiratory distress.  There is no stridor, accessory muscle use, tachypnea or hypoxia. Chest x-ray without pneumonia Will give her another DuoNeb and give her 60 mg prednisone p.o.  Also will obtain basic labs and a COVID and flu panel. On reassessment she is feeling much  improved after her second DuoNeb.  Her wheezing has improved on auscultation.  COVID and flu is negative.  Basic labs are unremarkable. Do not think this to be an acute bacterial pneumonia, flu, other causes of cough like GERD, postnasal, CHF, medication side effect.  She has no history of COPD. Will provide her with 4 more days of prednisone and I have refilled her albuterol inhaler.  She is given return precautions for worsening symptoms.  Should otherwise follow-up with her PCPs.  She verbalized understanding.  Independent labs interpretation:  The following labs were independently interpreted:  BMP potassium 3.1, likely albuterol use, will not replace at this time. CBC anemia at 11.2 baseline Respiratory panel COVID and flu negative hCG negative  Independent visualization of imaging: - I independently visualized the following imaging with scope of interpretation limited to determining acute life threatening conditions related to emergency care: Chest x-ray, which revealed no pneumonia, pleural effusion, pneumothorax  Consultation: - Consulted or discussed management/test interpretation w/ external professional: Not indicated  Consideration for admission or further workup: Social  Determinants of health: None identified Final Clinical Impression(s) / ED Diagnoses Final diagnoses:  Mild intermittent asthma with exacerbation    Rx / DC Orders ED Discharge Orders          Ordered    predniSONE (DELTASONE) 20 MG tablet  Daily        10/06/21 1241    albuterol (VENTOLIN HFA) 108 (90 Base) MCG/ACT inhaler  Every 4 hours PRN        10/06/21 1241              Cristopher Peru, PA-C 10/06/21 1244    Tegeler, Canary Brim, MD 10/06/21 [phone]

## 2021-11-26 ENCOUNTER — Encounter (HOSPITAL_COMMUNITY): Payer: Self-pay

## 2021-11-26 ENCOUNTER — Ambulatory Visit (HOSPITAL_COMMUNITY)
Admission: EM | Admit: 2021-11-26 | Discharge: 2021-11-26 | Disposition: A | Discharge status: Home / Self Care | Payer: Medicaid Other | Attending: Internal Medicine | Admitting: Internal Medicine

## 2021-11-26 DIAGNOSIS — R319 Hematuria, unspecified: Secondary | ICD-10-CM | POA: Diagnosis present

## 2021-11-26 DIAGNOSIS — N390 Urinary tract infection, site not specified: Secondary | ICD-10-CM | POA: Diagnosis not present

## 2021-11-26 DIAGNOSIS — R109 Unspecified abdominal pain: Secondary | ICD-10-CM | POA: Insufficient documentation

## 2021-11-26 LAB — POCT URINALYSIS DIPSTICK, ED / UC
Bilirubin Urine: NEGATIVE
Glucose, UA: NEGATIVE mg/dL
Ketones, ur: NEGATIVE mg/dL
Nitrite: POSITIVE — AB
Protein, ur: 100 mg/dL — AB
Specific Gravity, Urine: 1.02 (ref 1.005–1.030)
Urobilinogen, UA: 0.2 mg/dL (ref 0.0–1.0)
pH: 6 (ref 5.0–8.0)

## 2021-11-26 LAB — POC URINE PREG, ED: Preg Test, Ur: NEGATIVE

## 2021-11-26 MED ORDER — KETOROLAC TROMETHAMINE 30 MG/ML IJ SOLN
INTRAMUSCULAR | Status: AC
  Filled 2021-11-26: qty 1

## 2021-11-26 MED ORDER — KETOROLAC TROMETHAMINE 30 MG/ML IJ SOLN
30.0000 mg | Freq: Once | INTRAMUSCULAR | Status: AC
  Administered 2021-11-26: 30 mg via INTRAMUSCULAR

## 2021-11-26 MED ORDER — CIPROFLOXACIN HCL 500 MG PO TABS: 500.0000 mg | ORAL_TABLET | Freq: Two times a day (BID) | ORAL | 0 refills | Status: DC

## 2021-11-26 NOTE — ED Triage Notes (Signed)
Pt is here for right side abdominal pain for wks as per pt

## 2021-11-26 NOTE — ED Provider Notes (Signed)
[facility]    CSN: [id number] Arrival date & time: 11/26/21  1223      History   Chief Complaint Chief Complaint  Patient presents with   Abdominal Pain    HPI Sheryl Berg is a 31 y.o. female.  Patient complaining of right-sided flank pain that started 2-3 weeks ago.  Patient states pain does not worsen or improve with movement.  Patient denies any nausea, emesis, or changes to bowel habits.  She rates pain 7 out of 10.  Patient states pain has not worsened but has periods where it improves and other periods where it gets worse.  Patient has tried Tylenol and heating pad with no relief of symptoms.  Patient denies any history of kidney stones or any gastrointestinal problems.  Patient reports having a tubal ligation. LMP started on 11/23/2021.  She reports drinking 1.5 glasses of wine per week.  She reports drinking a lot of sweet tea and sodas when symptoms first started.  She states that she has tried to increase her water intake.    Abdominal Pain Associated symptoms: no chest pain, no chills, no constipation, no cough, no diarrhea, no dysuria, no fatigue, no fever, no hematuria, no nausea, no shortness of breath, no vaginal discharge and no vomiting     Past Medical History:  Diagnosis Date   Asthma    inhaler prn (states does not need often)   Hx of chlamydia infection     Patient Active Problem List   Diagnosis Date Noted   Postpartum tubal ligation planned 10/29/2014   SVD (spontaneous vaginal delivery) 10/28/2014   Pregnancy 10/26/2014    Past Surgical History:  Procedure Laterality Date   FINGER SURGERY     age 89 finger abcess and again in 2015   TUBAL LIGATION N/A 10/28/2014   Procedure: BILATERAL DISTAL POST PARTUM TUBAL LIGATION;  Surgeon: Tracey Harries, MD;  Location: [facility];  Service: Gynecology;  Laterality: N/A;   WISDOM TOOTH EXTRACTION      OB History     Gravida  5   Para  4   Term  4   Preterm  0   AB  1   Living  1       SAB  1   IAB  0   Ectopic  0   Multiple  0   Live Births  1            Home Medications    Prior to Admission medications   Medication Sig Start Date End Date Taking? Authorizing Provider  ciprofloxacin (CIPRO) 500 MG tablet Take 1 tablet (500 mg total) by mouth every 12 (twelve) hours. 11/26/21  Yes Debby Freiberg, NP  albuterol (VENTOLIN HFA) 108 (90 Base) MCG/ACT inhaler Inhale 2 puffs into the lungs every 4 (four) hours as needed for wheezing or shortness of breath. 08/06/21   Dione Booze, MD  albuterol (VENTOLIN HFA) 108 (90 Base) MCG/ACT inhaler Inhale 1-2 puffs into the lungs every 4 (four) hours as needed for wheezing or shortness of breath. 10/06/21   Cristopher Peru, PA-C    Family History Family History  Family history unknown: Yes    Social History Social History   Tobacco Use   Smoking status: Some Days   Smokeless tobacco: Never  Substance Use Topics   Alcohol use: No   Drug use: No     Allergies   Prednisone   Review of Systems Review of Systems  Constitutional:  Negative  for activity change, chills, diaphoresis, fatigue and fever.  Respiratory:  Negative for cough and shortness of breath.   Cardiovascular:  Negative for chest pain and palpitations.  Gastrointestinal:  Negative for abdominal pain, constipation, diarrhea, nausea and vomiting.  Genitourinary:  Positive for flank pain (RT sided). Negative for difficulty urinating, dyspareunia, dysuria, frequency, hematuria, menstrual problem, pelvic pain, urgency and vaginal discharge.  Neurological:  Negative for light-headedness.     Physical Exam Triage Vital Signs ED Triage Vitals  Enc Vitals Group     BP 11/26/21 1348 120/78     Pulse Rate 11/26/21 1348 77     Resp 11/26/21 1348 16     Temp 11/26/21 1348 98.1 F (36.7 C)     Temp Source 11/26/21 1348 Oral     SpO2 11/26/21 1348 97 %     Weight --      Height --      Head Circumference --      Peak Flow --      Pain  Score 11/26/21 1346 7     Pain Loc --      Pain Edu? --      Excl. in [place]? --    No data found.  Updated Vital Signs BP 120/78 (BP Location: Left Arm)   Pulse 77   Temp 98.1 F (36.7 C) (Oral)   Resp 16   LMP 11/26/2021   SpO2 97%   Physical Exam Vitals and nursing note reviewed.  Constitutional:      Appearance: She is well-developed. She is not ill-appearing or diaphoretic.  Cardiovascular:     Rate and Rhythm: Normal rate and regular rhythm.     Heart sounds: Normal heart sounds, S1 normal and S2 normal.  Pulmonary:     Effort: Pulmonary effort is normal.     Breath sounds: Normal breath sounds and air entry. No decreased breath sounds, wheezing, rhonchi or rales.  Abdominal:     General: Bowel sounds are normal. There is no distension.     Palpations: Abdomen is soft. There is no hepatomegaly or mass.     Tenderness: There is abdominal tenderness in the suprapubic area. There is no right CVA tenderness or left CVA tenderness. Negative signs include Murphy's sign and McBurney's sign.  Neurological:     Mental Status: She is alert.      UC Treatments / Results  Labs (all labs ordered are listed, but only abnormal results are displayed) Labs Reviewed  POCT URINALYSIS DIPSTICK, ED / UC - Abnormal; Notable for the following components:      Result Value   Hgb urine dipstick MODERATE (*)    Protein, ur 100 (*)    Nitrite POSITIVE (*)    Leukocytes,Ua SMALL (*)    All other components within normal limits  URINE CULTURE  POC URINE PREG, ED    EKG   Radiology No results found.  Procedures Procedures (including critical care time)  Medications Ordered in UC Medications  ketorolac (TORADOL) 30 MG/ML injection 30 mg (30 mg Intramuscular Given 11/26/21 1449)    Initial Impression / Assessment and Plan / UC Course  I have reviewed the triage vital signs and the nursing notes.  Pertinent labs & imaging results that were available during my care of the patient  were reviewed by me and considered in my medical decision making (see chart for details).     Patient is being treated for urinary tract infection.  Patient was given Toradol injection in  office due to pain level and discomfort.  Ciprofloxacin prescription sent to pharmacy and patient was educated on possible side effects . Urine culture sent to identify organism related to symptomology to verify appropriate treatment.  Patient had blood, protein, nitrites, and leukocytes present in urine.  This verifies diagnosis of urinary tract infection.  Uncertain if right flank pain is due to the start of infection traveling up to kidney or possible nephrolithiasis.  Patient is currently on menstrual cycle which could relate to the hemoglobin in urine.  Patient was given information for urologist if symptoms do not improve and educated on red flag symptoms that would warrant an emergency department visit.  Patient verbalized understanding of instructions.  Final Clinical Impressions(s) / UC Diagnoses   Final diagnoses:  Flank pain  Urinary tract infection with hematuria, site unspecified     Discharge Instructions      I am treating you for a urinary tract infection today.  Ciprofloxacin is the antibiotic sent to the pharmacy, take this medication twice a day for the next 5 days.  Please finish all of the antibiotic medication even if symptoms improve.  I advised taking this medication with a glass of water and having food on your stomach while taking the prescription.  Please remain hydrated, drinking at least 8 cups of water a day would be optimal.     You were given Toradol in office today. Please don't take any more NSAIDS today, such as Ibuprofen, Advil, or Aleve.   Urine culture was sent, if we need to make any changes on your plan of care we will give you a call.   As discussed, I have attached information about ciprofloxacin to your paperwork. Please monitor bowel movements, if you begin having  repeated water stools and/or foul odor with bowel movements please return to clinic.   I have attached information for urologist your paperwork.  If symptoms are not improving, you may follow-up with urology.   As discussed, if you begin having worsening or severe symptoms go to your nearest emergency department.        ED Prescriptions     Medication Sig Dispense Auth. Provider   ciprofloxacin (CIPRO) 500 MG tablet Take 1 tablet (500 mg total) by mouth every 12 (twelve) hours. 10 tablet Debby Freiberg, NP      PDMP not reviewed this encounter.   Debby Freiberg, NP 11/26/21 1519

## 2021-11-26 NOTE — Discharge Instructions (Addendum)
I am treating you for a urinary tract infection today.  Ciprofloxacin is the antibiotic sent to the pharmacy, take this medication twice a day for the next 5 days.  Please finish all of the antibiotic medication even if symptoms improve.  I advised taking this medication with a glass of water and having food on your stomach while taking the prescription.  Please remain hydrated, drinking at least 8 cups of water a day would be optimal.     You were given Toradol in office today. Please don't take any more NSAIDS today, such as Ibuprofen, Advil, or Aleve.   Urine culture was sent, if we need to make any changes on your plan of care we will give you a call.   As discussed, I have attached information about ciprofloxacin to your paperwork. Please monitor bowel movements, if you begin having repeated water stools and/or foul odor with bowel movements please return to clinic.   I have attached information for urologist your paperwork.  If symptoms are not improving, you may follow-up with urology.   As discussed, if you begin having worsening or severe symptoms go to your nearest emergency department.

## 2021-11-28 ENCOUNTER — Telehealth (HOSPITAL_COMMUNITY): Payer: Self-pay | Admitting: Emergency Medicine

## 2021-11-28 LAB — URINE CULTURE
Culture: >=100000 — AB
Special Requests: NORMAL

## 2021-11-28 MED ORDER — CEPHALEXIN 500 MG PO CAPS: 500.0000 mg | ORAL_CAPSULE | Freq: Two times a day (BID) | ORAL | 0 refills | Status: AC

## 2022-02-04 ENCOUNTER — Other Ambulatory Visit: Payer: Self-pay

## 2022-02-04 ENCOUNTER — Emergency Department (HOSPITAL_COMMUNITY): Payer: Medicaid Other

## 2022-02-04 ENCOUNTER — Emergency Department (HOSPITAL_COMMUNITY)
Admission: EM | Admit: 2022-02-04 | Discharge: 2022-02-05 | Disposition: A | Discharge status: Home / Self Care | Payer: Medicaid Other | Attending: Emergency Medicine | Admitting: Emergency Medicine

## 2022-02-04 DIAGNOSIS — J101 Influenza due to other identified influenza virus with other respiratory manifestations: Secondary | ICD-10-CM | POA: Diagnosis not present

## 2022-02-04 DIAGNOSIS — R0602 Shortness of breath: Secondary | ICD-10-CM | POA: Diagnosis not present

## 2022-02-04 DIAGNOSIS — J111 Influenza due to unidentified influenza virus with other respiratory manifestations: Secondary | ICD-10-CM

## 2022-02-04 DIAGNOSIS — J4541 Moderate persistent asthma with (acute) exacerbation: Secondary | ICD-10-CM

## 2022-02-04 DIAGNOSIS — Z20822 Contact with and (suspected) exposure to covid-19: Secondary | ICD-10-CM | POA: Diagnosis not present

## 2022-02-04 DIAGNOSIS — J45909 Unspecified asthma, uncomplicated: Secondary | ICD-10-CM | POA: Diagnosis not present

## 2022-02-04 MED ORDER — ALBUTEROL SULFATE HFA 108 (90 BASE) MCG/ACT IN AERS
4.0000 | INHALATION_SPRAY | Freq: Once | RESPIRATORY_TRACT | Status: AC
  Administered 2022-02-04: 4 via RESPIRATORY_TRACT
  Filled 2022-02-04: qty 6.7

## 2022-02-04 NOTE — ED Triage Notes (Signed)
Patient reports wheezing with SOB , productive cough onset this morning unrelieved by her asthma inhaler.

## 2022-02-04 NOTE — ED Provider Triage Note (Signed)
Emergency Medicine Provider Triage Evaluation Note  Sheryl Berg , a 31 y.o. female  was evaluated in triage.  Pt complains of asthma been going on for 1 day, been using inhaler relief, states she feels chest tightness, the redness the past no cough no congestion.  Review of Systems  Positive: Asthma, chest tightness Negative: Pleuritic chest pain swelling  Physical Exam  BP 125/81 (BP Location: Right Arm)   Pulse (!) 114   Temp 99.4 F (37.4 C) (Oral)   Resp 20   SpO2 95%  Gen:   Awake, no distress   Resp:  Normal effort  MSK:   Moves extremities without difficulty  Other:  No evidence of rest or distress nontachypneic nonhypoxic she does have wheezing heard bilaterally, will provide with albuterol  Medical Decision Making  Medically screening exam initiated at 11:21 PM.  Appropriate orders placed.  Sheryl Berg was informed that the remainder of the evaluation will be completed by another provider, this initial triage assessment does not replace that evaluation, and the importance of remaining in the ED until their evaluation is complete.  Lab work imaging has been ordered will need further workup.   Sheryl Sage, PA-C 02/04/22 2322

## 2022-02-05 ENCOUNTER — Other Ambulatory Visit (HOSPITAL_COMMUNITY): Payer: Self-pay

## 2022-02-05 LAB — CBC WITH DIFFERENTIAL/PLATELET
Abs Immature Granulocytes: 0.03 10*3/uL (ref 0.00–0.07)
Basophils Absolute: 0 10*3/uL (ref 0.0–0.1)
Basophils Relative: 0 %
Eosinophils Absolute: 0.1 10*3/uL (ref 0.0–0.5)
Eosinophils Relative: 1 %
HCT: 36.5 % (ref 36.0–46.0)
Hemoglobin: 11.3 g/dL — ABNORMAL LOW (ref 12.0–15.0)
Immature Granulocytes: 0 %
Lymphocytes Relative: 5 %
Lymphs Abs: 0.4 10*3/uL — ABNORMAL LOW (ref 0.7–4.0)
MCH: 27.3 pg (ref 26.0–34.0)
MCHC: 31 g/dL (ref 30.0–36.0)
MCV: 88.2 fL (ref 80.0–100.0)
Monocytes Absolute: 0.6 10*3/uL (ref 0.1–1.0)
Monocytes Relative: 8 %
Neutro Abs: 6 10*3/uL (ref 1.7–7.7)
Neutrophils Relative %: 86 %
Platelets: 224 10*3/uL (ref 150–400)
RBC: 4.14 MIL/uL (ref 3.87–5.11)
RDW: 15.9 % — ABNORMAL HIGH (ref 11.5–15.5)
WBC: 7 10*3/uL (ref 4.0–10.5)
nRBC: 0 % (ref 0.0–0.2)

## 2022-02-05 LAB — RESP PANEL BY RT-PCR (RSV, FLU A&B, COVID)  RVPGX2
Influenza A by PCR: POSITIVE — AB
Influenza B by PCR: NEGATIVE
Resp Syncytial Virus by PCR: NEGATIVE
SARS Coronavirus 2 by RT PCR: NEGATIVE

## 2022-02-05 LAB — BASIC METABOLIC PANEL
Anion gap: 11 (ref 5–15)
BUN: 8 mg/dL (ref 6–20)
CO2: 21 mmol/L — ABNORMAL LOW (ref 22–32)
Calcium: 9.2 mg/dL (ref 8.9–10.3)
Chloride: 103 mmol/L (ref 98–111)
Creatinine, Ser: 0.83 mg/dL (ref 0.44–1.00)
GFR, Estimated: >60 mL/min (ref >60)
Glucose, Bld: 133 mg/dL — ABNORMAL HIGH (ref 70–99)
Potassium: 3.8 mmol/L (ref 3.5–5.1)
Sodium: 135 mmol/L (ref 135–145)

## 2022-02-05 LAB — LACTIC ACID, PLASMA: Lactic Acid, Venous: 2.2 mmol/L (ref 0.5–1.9)

## 2022-02-05 MED ORDER — METHYLPREDNISOLONE SODIUM SUCC 125 MG IJ SOLR
125.0000 mg | Freq: Once | INTRAMUSCULAR | Status: AC
  Administered 2022-02-05: 125 mg via INTRAVENOUS
  Filled 2022-02-05: qty 2

## 2022-02-05 MED ORDER — BUDESONIDE 0.25 MG/2ML IN SUSP
0.2500 mg | Freq: Once | RESPIRATORY_TRACT | Status: AC
  Administered 2022-02-05: 0.25 mg via RESPIRATORY_TRACT
  Filled 2022-02-05: qty 2

## 2022-02-05 MED ORDER — FLUTICASONE PROPIONATE HFA 44 MCG/ACT IN AERO
2.0000 | INHALATION_SPRAY | Freq: Two times a day (BID) | RESPIRATORY_TRACT | 2 refills | Status: AC
  Filled 2022-02-05: qty 10.6, 30d supply, fill #0

## 2022-02-05 MED ORDER — SODIUM CHLORIDE 0.9 % IV BOLUS
1000.0000 mL | Freq: Once | INTRAVENOUS | Status: AC
  Administered 2022-02-05: 1000 mL via INTRAVENOUS

## 2022-02-05 MED ORDER — FLUTICASONE PROPIONATE HFA 44 MCG/ACT IN AERO: 2.0000 | INHALATION_SPRAY | Freq: Once | RESPIRATORY_TRACT | Status: DC

## 2022-02-05 MED ORDER — METHYLPREDNISOLONE 4 MG PO TBPK: ORAL_TABLET | ORAL | 0 refills | Status: DC

## 2022-02-05 MED ORDER — ALBUTEROL SULFATE HFA 108 (90 BASE) MCG/ACT IN AERS
1.0000 | INHALATION_SPRAY | Freq: Once | RESPIRATORY_TRACT | Status: AC
  Administered 2022-02-05: 1 via RESPIRATORY_TRACT
  Filled 2022-02-05: qty 6.7

## 2022-02-05 MED ORDER — IBUPROFEN 400 MG PO TABS: 600.0000 mg | ORAL_TABLET | Freq: Once | ORAL | Status: DC

## 2022-02-05 MED ORDER — AEROCHAMBER PLUS FLO-VU MEDIUM MISC: 1.0000 | Freq: Once | Status: DC

## 2022-02-05 MED ORDER — ALBUTEROL SULFATE (2.5 MG/3ML) 0.083% IN NEBU
2.5000 mg | INHALATION_SOLUTION | Freq: Once | RESPIRATORY_TRACT | Status: AC
  Administered 2022-02-05: 2.5 mg via RESPIRATORY_TRACT
  Filled 2022-02-05: qty 3

## 2022-02-05 MED ORDER — ACETAMINOPHEN 325 MG PO TABS
650.0000 mg | ORAL_TABLET | Freq: Once | ORAL | Status: AC
  Administered 2022-02-05: 650 mg via ORAL
  Filled 2022-02-05: qty 2

## 2022-02-05 NOTE — ED Provider Notes (Signed)
[facility] EMERGENCY DEPARTMENT Provider Note   CSN: [id number] Arrival date & time: 02/04/22  2113     History  Chief Complaint  Patient presents with   Asthma    Sheryl Berg is a 31 y.o. female.  HPI   Patient with medical history including asthma presents with complaints of shortness of breath and chest tightness.  Patient states this started yesterday, states she felt as if her chest got very tight and started to wheeze, she states that she started taking her albuterol inhaler without much relief, she denies any cough congestion shortness of breath stomach pains nausea vomiting general body aches.  She denies any recent sick contacts, she is not immunocompromise, she has not gotten her COVID or her influenza vaccine.    Home Medications Prior to Admission medications   Medication Sig Start Date End Date Taking? Authorizing Provider  albuterol (VENTOLIN HFA) 108 (90 Base) MCG/ACT inhaler Inhale 2 puffs into the lungs every 4 (four) hours as needed for wheezing or shortness of breath. 08/06/21   Dione Booze, MD  albuterol (VENTOLIN HFA) 108 (90 Base) MCG/ACT inhaler Inhale 1-2 puffs into the lungs every 4 (four) hours as needed for wheezing or shortness of breath. 10/06/21   Cristopher Peru, PA-C  ciprofloxacin (CIPRO) 500 MG tablet Take 1 tablet (500 mg total) by mouth every 12 (twelve) hours. 11/26/21   Debby Freiberg, NP      Allergies    Prednisone    Review of Systems   Review of Systems  Constitutional:  Negative for chills and fever.  Respiratory:  Positive for chest tightness, shortness of breath and wheezing.   Cardiovascular:  Negative for chest pain.  Gastrointestinal:  Negative for abdominal pain.  Neurological:  Negative for headaches.    Physical Exam Updated Vital Signs BP (!) 119/56   Pulse (!) 131   Temp (!) 101.1 F (38.4 C) (Oral)   Resp 20   SpO2 97%  Physical Exam Vitals and nursing note reviewed.   Constitutional:      General: She is not in acute distress.    Appearance: She is not ill-appearing.  HENT:     Head: Normocephalic and atraumatic.     Nose: No congestion.  Eyes:     Conjunctiva/sclera: Conjunctivae normal.  Cardiovascular:     Rate and Rhythm: Regular rhythm. Tachycardia present.     Pulses: Normal pulses.     Heart sounds: No murmur heard.    No friction rub. No gallop.  Pulmonary:     Effort: No respiratory distress.     Breath sounds: Wheezing present. No rhonchi or rales.     Comments: No evidence of respiratory distress nontachypneic nonhypoxic, patient does have a slightly tight sounding chest with expiratory wheezing heard, no rales or rhonchi present. Musculoskeletal:     Right lower leg: No edema.     Left lower leg: No edema.  Skin:    General: Skin is warm and dry.  Neurological:     Mental Status: She is alert.  Psychiatric:        Mood and Affect: Mood normal.     ED Results / Procedures / Treatments   Labs (all labs ordered are listed, but only abnormal results are displayed) Labs Reviewed  RESP PANEL BY RT-PCR (RSV, FLU A&B, COVID)  RVPGX2 - Abnormal; Notable for the following components:      Result Value   Influenza A by PCR POSITIVE (*)  All other components within normal limits  BASIC METABOLIC PANEL - Abnormal; Notable for the following components:   CO2 21 (*)    Glucose, Bld 133 (*)    All other components within normal limits  CBC WITH DIFFERENTIAL/PLATELET - Abnormal; Notable for the following components:   Hemoglobin 11.3 (*)    RDW 15.9 (*)    Lymphs Abs 0.4 (*)    All other components within normal limits  LACTIC ACID, PLASMA - Abnormal; Notable for the following components:   Lactic Acid, Venous 2.2 (*)    All other components within normal limits  LACTIC ACID, PLASMA    EKG None  Radiology DG Chest 2 View  Result Date: 02/04/2022 CLINICAL DATA:  Short of breath, asthma EXAM: CHEST - 2 VIEW COMPARISON:   10/06/2021 FINDINGS: The heart size and mediastinal contours are within normal limits. Both lungs are clear. The visualized skeletal structures are unremarkable. IMPRESSION: No active cardiopulmonary disease. Electronically Signed   By: Sharlet Salina M.D.   On: 02/04/2022 23:47    Procedures Procedures    Medications Ordered in ED Medications  albuterol (VENTOLIN HFA) 108 (90 Base) MCG/ACT inhaler 4 puff (4 puffs Inhalation Given 02/04/22 2324)  albuterol (PROVENTIL) (2.5 MG/3ML) 0.083% nebulizer solution 2.5 mg (2.5 mg Nebulization Given 02/05/22 0124)  sodium chloride 0.9 % bolus 1,000 mL (1,000 mLs Intravenous New Bag/Given 02/05/22 0530)  methylPREDNISolone sodium succinate (SOLU-MEDROL) 125 mg/2 mL injection 125 mg (125 mg Intravenous Given 02/05/22 0518)  acetaminophen (TYLENOL) tablet 650 mg (650 mg Oral Given 02/05/22 0551)  sodium chloride 0.9 % bolus 1,000 mL (1,000 mLs Intravenous New Bag/Given 02/05/22 1829)    ED Course/ Medical Decision Making/ A&P                           Medical Decision Making Amount and/or Complexity of Data Reviewed Labs: ordered. Radiology: ordered.  Risk OTC drugs. Prescription drug management.   This patient presents to the ED for concern of shortness of breath, this involves an extensive number of treatment options, and is a complaint that carries with it a high risk of complications and morbidity.  The differential diagnosis includes PE, ACS, pneumonia, asthma exacerbation    Additional history obtained:  Additional history obtained from partner at bedside External records from outside source obtained and reviewed including recent ER notes   Co morbidities that complicate the patient evaluation  Asthma  Social Determinants of Health:  No primary care provider    Lab Tests:  I Ordered, and personally interpreted labs.  The pertinent results include: Respiratory panel positive for COVID   Imaging Studies ordered:  I  ordered imaging studies including chest x-ray I independently visualized and interpreted imaging which showed negative I agree with the radiologist interpretation   Cardiac Monitoring:  The patient was maintained on a cardiac monitor.  I personally viewed and interpreted the cardiac monitored which showed an underlying rhythm of: Sinus tach without signs of ischemia   Medicines ordered and prescription drug management:  I ordered medication including fluids, bronchodilators, steroids I have reviewed the patients home medicines and have made adjustments as needed  Critical Interventions:  N/A   Reevaluation:  Presents with shortness of breath, triage obtain lab work which I personally reviewed positive for COVID, she is significantly tachycardic during my examination, she has bilateral wheezing heard, will provide with fluids, Solu-Medrol, reassess  Patient has a lactic of 2.2, she is also febrile,  will provide with additional fluids, antipyretics, lung sounds were assess only minimal wheezing present.    Consultations Obtained:  N/A    Test Considered:  N/A    Rule out I have low suspicion for ACS as history is atypical, patient has no cardiac history, EKG was without signs of ischemia, patient had a delta troponin.  Low suspicion for PE as patient denies pleuritic chest pain, patient denies leg pain, no pedal edema noted on exam, she is not on oral birth control, no recent surgeries no long immobilizations, patient is noted to be tachycardic but I suspect this is secondary due to influenza as well as multiple uses of albuterol as patient states that she has been taking puffs of her inhaler while she has been in the emergency room.   My suspicion for sepsis is also lower at this time as she has no leukocytosis, she is not hypotensive, she does have a acidosis and is tachycardic but again this is likely secondary due to no influenza, I suspect with fluids and antipyretics  tachycardia and elevated lactic will resolve    Dispostion and problem list  Due to shift change patient will be handed over to  wylder fondaw PAC  Reassessed patient's lactic, if downtrending, heart rate has improved, she remains on room air, lung sounds remain stable likely patient can be discharged home with prednisone as well as Tamiflu.  But if she has an uptrending lactic heart rate remains elevated or becomes hypoxic she will likely need admission for influenza.            Final Clinical Impression(s) / ED Diagnoses Final diagnoses:  Moderate persistent asthma with exacerbation  Influenza    Rx / DC Orders ED Discharge Orders     None         Carroll Sage, PA-C 02/05/22 0865    Sabas Sous, MD 02/05/22 [phone]

## 2022-02-05 NOTE — Discharge Instructions (Addendum)
Take the steroids as prescribed, use the Flovent inhaler, hydrate, take your prescribed medications.  Return to emergency room for any new or concerning symptoms

## 2022-02-05 NOTE — ED Notes (Signed)
Oxygen removed, pt sating at 97% RA

## 2022-02-05 NOTE — ED Provider Notes (Signed)
Accepted handoff at shift change from [facility] PA-C. Please see prior provider note for more detail.   Briefly: Patient is 31 y.o.     Plan: Monitor and reevaluate      Physical Exam  BP (!) 106/54   Pulse (!) 132   Temp (!) 101.1 F (38.4 C) (Oral)   Resp 20   SpO2 95%   Physical Exam Vitals and nursing note reviewed.  Constitutional:      General: She is not in acute distress. HENT:     Head: Normocephalic and atraumatic.     Nose: Nose normal.  Eyes:     General: No scleral icterus. Cardiovascular:     Rate and Rhythm: Normal rate and regular rhythm.     Pulses: Normal pulses.     Heart sounds: Normal heart sounds.  Pulmonary:     Effort: Pulmonary effort is normal. No respiratory distress.     Breath sounds: Wheezing present.     Comments: Pain at end expiratory wheeze, no tachypnea, speaking in full sentences Abdominal:     Palpations: Abdomen is soft.     Tenderness: There is no abdominal tenderness.  Musculoskeletal:     Cervical back: Normal range of motion.     Right lower leg: No edema.     Left lower leg: No edema.  Skin:    General: Skin is warm and dry.     Capillary Refill: Capillary refill takes less than 2 seconds.  Neurological:     Mental Status: She is alert. Mental status is at baseline.  Psychiatric:        Mood and Affect: Mood normal.        Behavior: Behavior normal.     Procedures  Procedures Results for orders placed or performed during the hospital encounter of 02/04/22  Resp panel by RT-PCR (RSV, Flu A&B, Covid) Anterior Nasal Swab   Specimen: Anterior Nasal Swab  Result Value Ref Range   SARS Coronavirus 2 by RT PCR NEGATIVE NEGATIVE   Influenza A by PCR POSITIVE (A) NEGATIVE   Influenza B by PCR NEGATIVE NEGATIVE   Resp Syncytial Virus by PCR NEGATIVE NEGATIVE  Basic metabolic panel  Result Value Ref Range   Sodium 135 135 - 145 mmol/L   Potassium 3.8 3.5 - 5.1 mmol/L   Chloride 103 98 - 111 mmol/L   CO2 21 (L) 22 - 32  mmol/L   Glucose, Bld 133 (H) 70 - 99 mg/dL   BUN 8 6 - 20 mg/dL   Creatinine, Ser 6.04 0.44 - 1.00 mg/dL   Calcium 9.2 8.9 - 54.0 mg/dL   GFR, Estimated >98 >11 mL/min   Anion gap 11 5 - 15  CBC with Differential  Result Value Ref Range   WBC 7.0 4.0 - 10.5 K/uL   RBC 4.14 3.87 - 5.11 MIL/uL   Hemoglobin 11.3 (L) 12.0 - 15.0 g/dL   HCT 91.4 78.2 - 95.6 %   MCV 88.2 80.0 - 100.0 fL   MCH 27.3 26.0 - 34.0 pg   MCHC 31.0 30.0 - 36.0 g/dL   RDW 21.3 (H) 08.6 - 57.8 %   Platelets 224 150 - 400 K/uL   nRBC 0.0 0.0 - 0.2 %   Neutrophils Relative % 86 %   Neutro Abs 6.0 1.7 - 7.7 K/uL   Lymphocytes Relative 5 %   Lymphs Abs 0.4 (L) 0.7 - 4.0 K/uL   Monocytes Relative 8 %   Monocytes Absolute 0.6 0.1 -  1.0 K/uL   Eosinophils Relative 1 %   Eosinophils Absolute 0.1 0.0 - 0.5 K/uL   Basophils Relative 0 %   Basophils Absolute 0.0 0.0 - 0.1 K/uL   Immature Granulocytes 0 %   Abs Immature Granulocytes 0.03 0.00 - 0.07 K/uL  Lactic acid, plasma  Result Value Ref Range   Lactic Acid, Venous 2.2 (HH) 0.5 - 1.9 mmol/L   DG Chest 2 View  Result Date: 02/04/2022 CLINICAL DATA:  Short of breath, asthma EXAM: CHEST - 2 VIEW COMPARISON:  10/06/2021 FINDINGS: The heart size and mediastinal contours are within normal limits. Both lungs are clear. The visualized skeletal structures are unremarkable. IMPRESSION: No active cardiopulmonary disease. Electronically Signed   By: Sharlet Salina M.D.   On: 02/04/2022 23:47      ED Course / MDM    Medical Decision Making Amount and/or Complexity of Data Reviewed Labs: ordered. Radiology: ordered.  Risk OTC drugs. Prescription drug management.   I reevaluated patient she feels much improved her tachycardia has resolved at this point with for heart rate around 95 on my last evaluation.  Satting 98% on room air respirations unlabored and 15, vital signs normal afebrile.  Elevated lactic originally this elevated lactic loose in the setting of  albuterol use which was quite heavy seems that she became quite tachycardic with the heavy albuterol use.   Her tachycardia now having resolved her lack of fever lack of pneumonia on chest x-ray are all reassuring.  I believe this is a type II lactic acidosis.  I did initially attempt to obtain a second lactic acid level however after several hours of waiting patient understandably decided she did not want a second lactic acid level obtained I think it is reasonable to discharge her home without this.  Nursing shortages are the primary reason why we were not able to obtain this in a more expeditious manner.  She did in the meantime received a nebulization of Pulmicort that I provided her after discussing with pharmacy and was able to give her a Flovent inhaler from the pharmacy here.  Will discharge home with Solu-Medrol Dosepak.  Return precautions discussed will follow-up with PCP.    Gailen Shelter, [place] 02/05/22 1340    Benjiman Core, MD 02/06/22 [phone]

## 2022-02-05 NOTE — ED Notes (Signed)
Critical result lactic 2.2 PA Uvaldo Rising advised

## 2022-02-05 NOTE — ED Notes (Signed)
Pt placed on 2L O2 [place]

## 2022-05-22 ENCOUNTER — Other Ambulatory Visit: Payer: Self-pay | Admitting: Nurse Practitioner

## 2022-05-22 DIAGNOSIS — Z131 Encounter for screening for diabetes mellitus: Secondary | ICD-10-CM | POA: Diagnosis not present

## 2022-05-22 DIAGNOSIS — Z130 Encounter for screening for diseases of the blood and blood-forming organs and certain disorders involving the immune mechanism: Secondary | ICD-10-CM | POA: Diagnosis not present

## 2022-05-22 DIAGNOSIS — Z0000 Encounter for general adult medical examination without abnormal findings: Secondary | ICD-10-CM | POA: Diagnosis not present

## 2022-05-22 DIAGNOSIS — Z1239 Encounter for other screening for malignant neoplasm of breast: Secondary | ICD-10-CM

## 2022-06-21 IMAGING — CT CT ABD-PELV W/ CM
2 of 4 series · 16 of 46 positions shown, 18 images · IV contrast (omnipaque)
Comparison: None.

CLINICAL DATA: Lower abdominal pain and vaginal bleeding.

EXAM:
CT ABDOMEN AND PELVIS WITH CONTRAST
TECHNIQUE: Multidetector CT imaging of the abdomen and pelvis was performed
using the standard protocol following bolus administration of
intravenous contrast.
CONTRAST:  100mL OMNIPAQUE IOHEXOL 300 MG/ML  SOLN

[Series 3: a/p w/ 5mm · axial · 0.92mm/px · z∈[+761,+1191]mm · 13 of 94 slices shown, 15 images]
[im 4/94  soft-tissue]
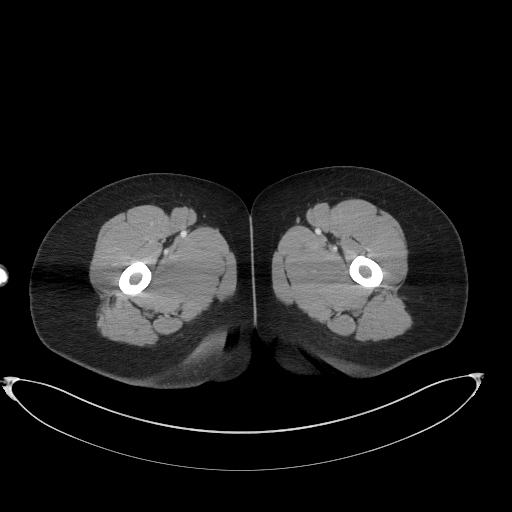
[im 4/94  bone]
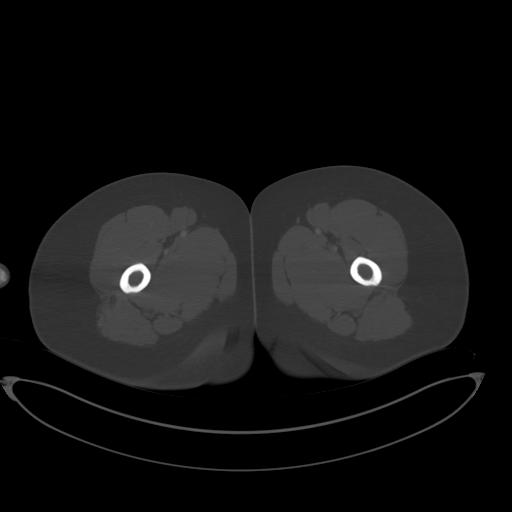
[im 11/94  soft-tissue]
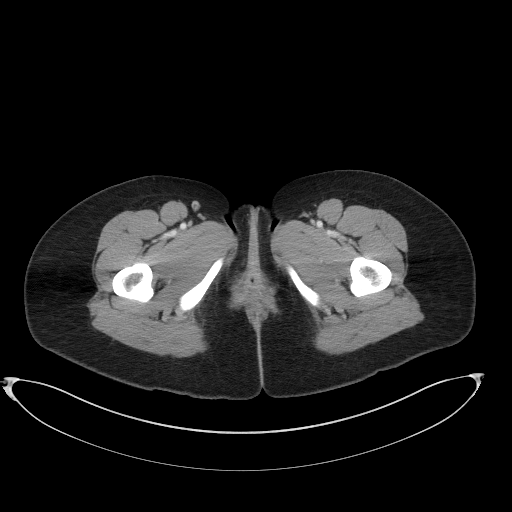
[im 18/94  soft-tissue]
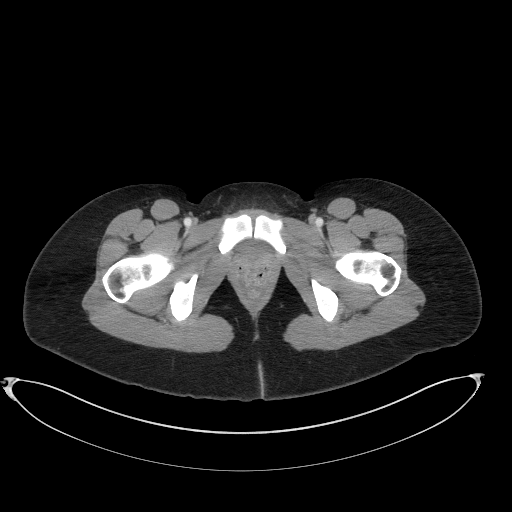
[im 26/94  soft-tissue]
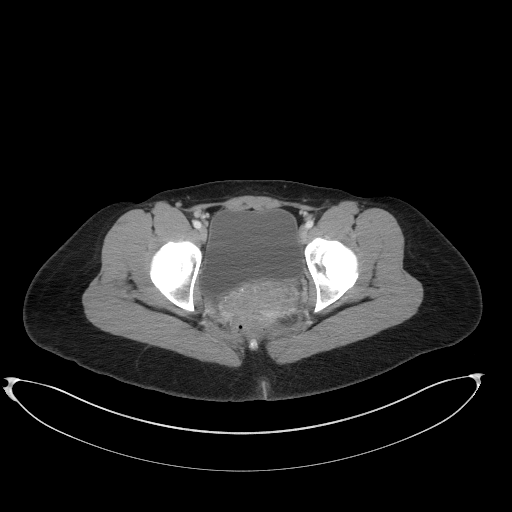
[im 33/94  soft-tissue]
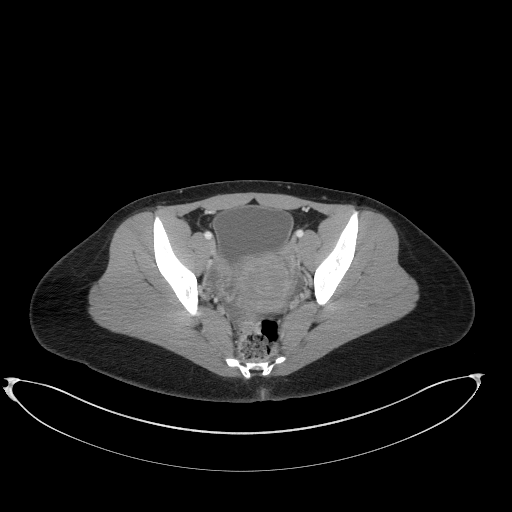
[im 40/94  soft-tissue]
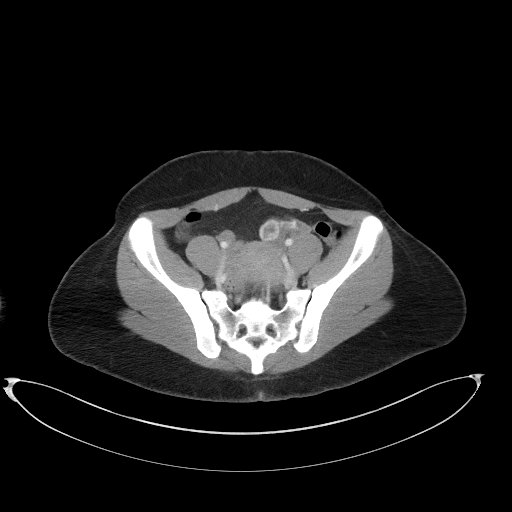
[im 47/94  soft-tissue]
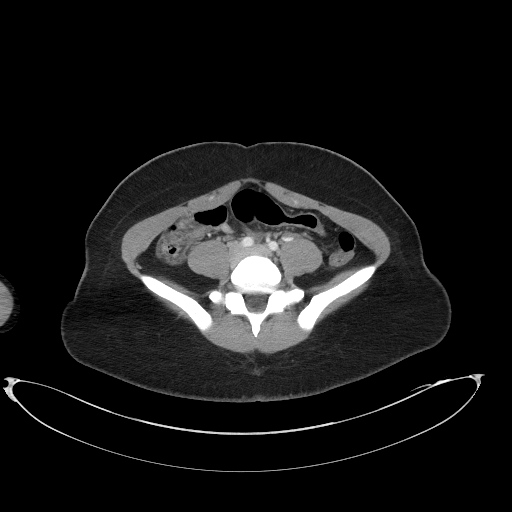
[im 54/94  soft-tissue]
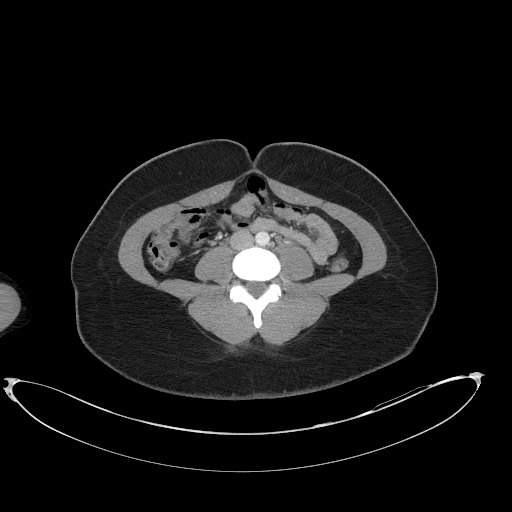
[im 61/94  soft-tissue]
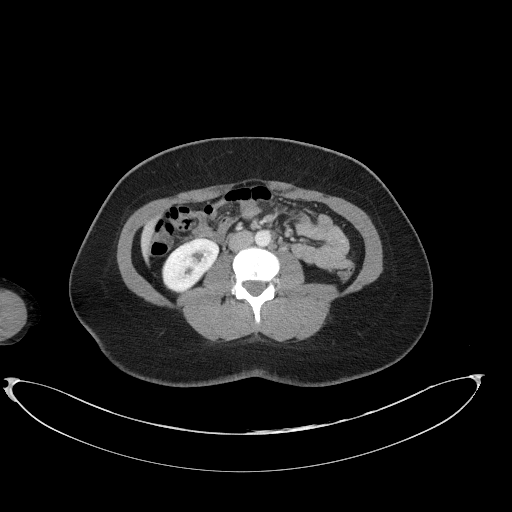
[im 61/94  bone]
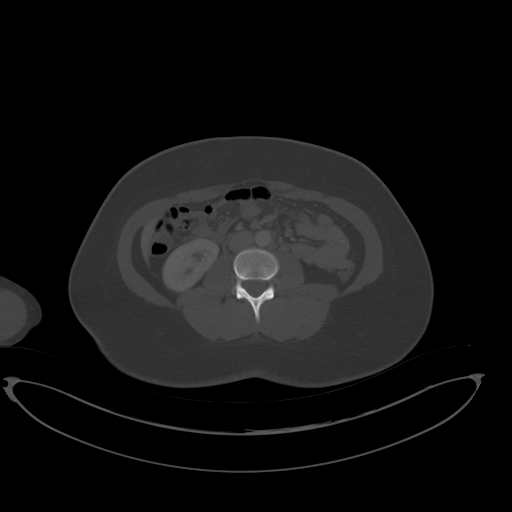
[im 68/94  soft-tissue]
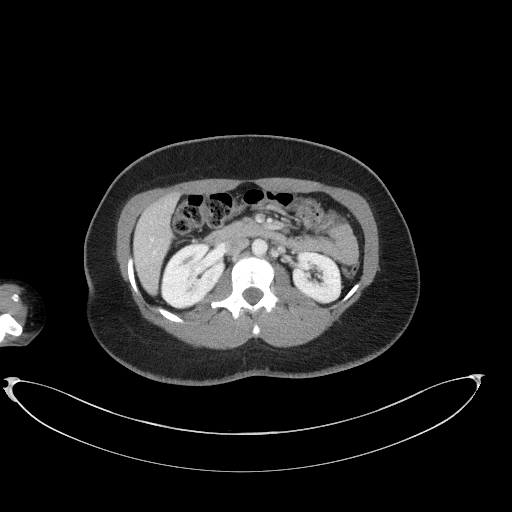
[im 76/94  soft-tissue]
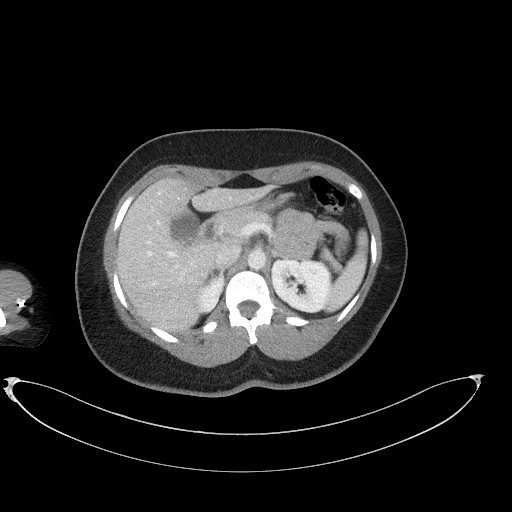
[im 83/94  soft-tissue]
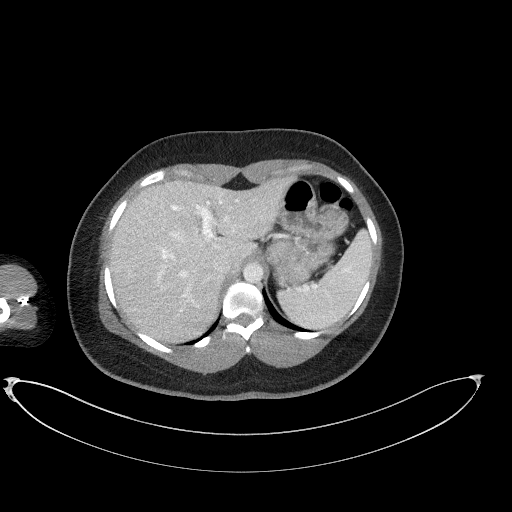
[im 90/94  soft-tissue]
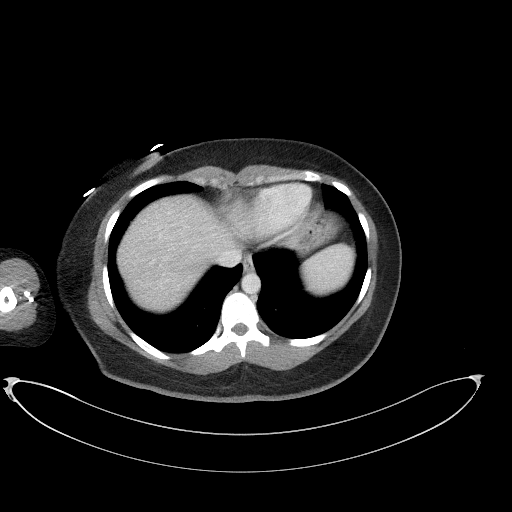

[Series 6: a/p w/ cor · coronal · 0.82mm/px · 3 of 142 slices shown]
[im 48/142  soft-tissue]
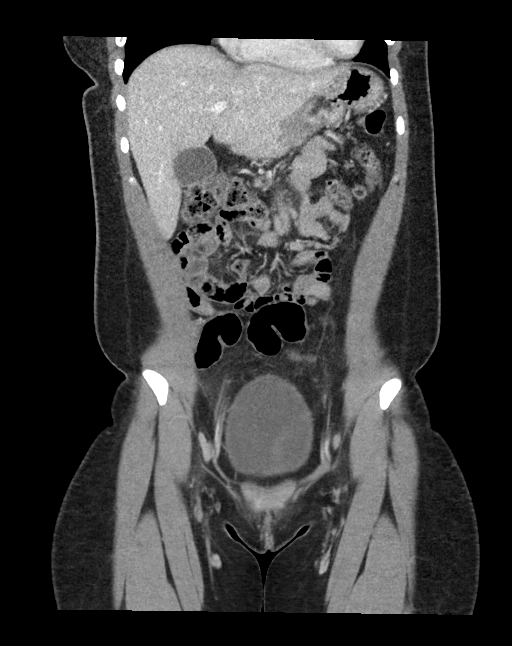
[im 63/142  soft-tissue]
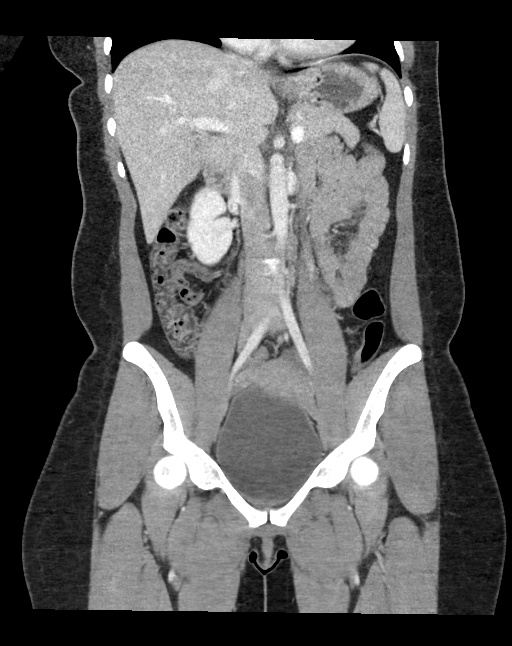
[im 79/142  soft-tissue]
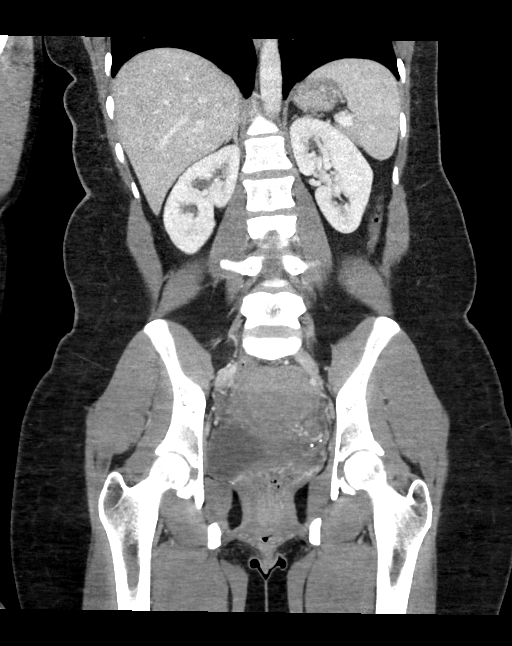

[16 of 46 positions shown; findings below may reference images not displayed]

FINDINGS: Lower chest: No acute abnormality.

Hepatobiliary: No focal liver abnormality is seen. Layering
gallbladder sludge. No gallstones, gallbladder wall thickening, or
biliary dilatation.

Pancreas: Unremarkable. No pancreatic ductal dilatation or
surrounding inflammatory changes.

Spleen: Normal in size without focal abnormality.

Adrenals/Urinary Tract: Adrenal glands are unremarkable. Kidneys are
normal, without renal calculi, focal lesion, or hydronephrosis.
Bladder is unremarkable.

Stomach/Bowel: Stomach is within normal limits. Appendix appears
normal (series 6, image 60). No evidence of bowel wall thickening,
distention, or inflammatory changes.

Vascular/Lymphatic: No significant vascular findings are present. No
enlarged abdominal or pelvic lymph nodes.

Reproductive: Uterus and bilateral adnexa are unremarkable. Left
ovarian corpus luteum.

Other: Trace free fluid in the pelvis, likely physiologic. No
pneumoperitoneum.

Musculoskeletal: No acute or significant osseous findings.
IMPRESSION: 1. No acute intra-abdominal process.
2. Gallbladder sludge.

## 2022-06-29 ENCOUNTER — Encounter (HOSPITAL_COMMUNITY): Payer: Self-pay

## 2022-06-29 ENCOUNTER — Emergency Department (HOSPITAL_COMMUNITY)
Admission: EM | Admit: 2022-06-29 | Discharge: 2022-06-29 | Disposition: A | Discharge status: Home / Self Care | Payer: Medicaid Other | Attending: Emergency Medicine | Admitting: Emergency Medicine

## 2022-06-29 ENCOUNTER — Other Ambulatory Visit: Payer: Self-pay

## 2022-06-29 DIAGNOSIS — W5501XA Bitten by cat, initial encounter: Secondary | ICD-10-CM | POA: Insufficient documentation

## 2022-06-29 DIAGNOSIS — B9789 Other viral agents as the cause of diseases classified elsewhere: Secondary | ICD-10-CM | POA: Diagnosis not present

## 2022-06-29 DIAGNOSIS — Z1152 Encounter for screening for COVID-19: Secondary | ICD-10-CM | POA: Diagnosis not present

## 2022-06-29 DIAGNOSIS — S61253A Open bite of left middle finger without damage to nail, initial encounter: Secondary | ICD-10-CM | POA: Insufficient documentation

## 2022-06-29 DIAGNOSIS — J069 Acute upper respiratory infection, unspecified: Secondary | ICD-10-CM | POA: Insufficient documentation

## 2022-06-29 LAB — RESP PANEL BY RT-PCR (RSV, FLU A&B, COVID)  RVPGX2
Influenza A by PCR: NEGATIVE
Influenza B by PCR: NEGATIVE
Resp Syncytial Virus by PCR: NEGATIVE
SARS Coronavirus 2 by RT PCR: NEGATIVE

## 2022-06-29 MED ORDER — METHYLPREDNISOLONE 4 MG PO TBPK: ORAL_TABLET | ORAL | 0 refills | Status: AC

## 2022-06-29 MED ORDER — AMOXICILLIN-POT CLAVULANATE 875-125 MG PO TABS: 1.0000 | ORAL_TABLET | Freq: Two times a day (BID) | ORAL | 0 refills | Status: AC

## 2022-06-29 NOTE — Discharge Instructions (Addendum)
The workup today was overall reassuring.  As discussed, we will send an antibiotic called Augmentin to take for prevention of infection of cat bite.  Look for worsening signs of progressing redness, swelling and pain in affected finger.  Will also send in Medrol Dosepak given concern for mild asthma exacerbation.  Please do not hesitate to return to emergency department for worrisome signs and symptoms we discussed become apparent.

## 2022-06-29 NOTE — ED Triage Notes (Signed)
PT arrives ambulatory POV with a c/o of a cat bite on left  finger which occurred 2 days ago. Pt states that it was her cat and it was while her cat was giving birth.Pt also states cat has had her 1st round of rabies shots. Pt is also experiencing coughing episodes and cold symptoms.

## 2022-06-29 NOTE — ED Provider Notes (Signed)
[place] EMERGENCY DEPARTMENT AT [facility] Provider Note   CSN: [id number] Arrival date & time: 06/29/22  1130     History  Chief Complaint  Patient presents with   Animal Bite    Sheryl Berg is a 32 y.o. female.   Animal Bite   32 year old female presents emergency department with complaints of cat bite as well as cough and sore throat.  Patient states that symptoms began 2 days ago.  States that her cat is giving birth and she went to care for the cat when the cat bit her left middle finger.  States she has had some pain but no redness or discharge to affected finger.  She reports Having first round of rabies vaccination series but states the cat has been behaving within normal limits and is not concerned about contracting rabies.  States that earlier that same day, developed mildly sore throat and cough.  States it feels similar to in the past when she was diagnosed with flu.  Denies fever.  Reports some feelings of shortness of breath which has responded to her albuterol inhaler at home.  Denies chest pain, abdominal pain, nausea, vomiting or urinary symptoms, change in bowel habits.  Past medical history significant for asthma  Home Medications Prior to Admission medications   Medication Sig Start Date End Date Taking? Authorizing Provider  amoxicillin-clavulanate (AUGMENTIN) 875-125 MG tablet Take 1 tablet by mouth every 12 (twelve) hours. 06/29/22  Yes Peter Garter, PA  methylPREDNISolone (MEDROL DOSEPAK) 4 MG TBPK tablet Florella Abril 06/29/22  Yes Sherian Maroon A, PA  acetaminophen (TYLENOL) 500 MG tablet Take 1,000 mg by mouth every 6 (six) hours as needed for mild pain, moderate pain or headache.    [provider]  albuterol (VENTOLIN HFA) 108 (90 Base) MCG/ACT inhaler Inhale 1-2 puffs into the lungs every 4 (four) hours as needed for wheezing or shortness of breath. Patient taking differently: Inhale 2 puffs into the lungs every 4  (four) hours as needed for wheezing or shortness of breath. 10/06/21   Cristopher Peru, PA-C  cetirizine (ZYRTEC) 10 MG tablet Take 10 mg by mouth daily. 11/22/21   [provider]  fluticasone (FLONASE) 50 MCG/ACT nasal spray Place 1 spray into both nostrils daily as needed for allergies. 11/22/21   [provider]  fluticasone (FLOVENT HFA) 44 MCG/ACT inhaler Inhale 2 puffs into the lungs 2 (two) times daily. 02/05/22 02/05/23  Gailen Shelter, PA      Allergies    Prednisone    Review of Systems   Review of Systems  All other systems reviewed and are negative.   Physical Exam Updated Vital Signs BP 118/76 (BP Location: Right Arm)   Pulse 80   Temp 97.9 F (36.6 C) (Oral)   Resp 17   Ht 5\' 4"  (1.626 m)   Wt 77.8 kg   SpO2 99%   BMI 29.44 kg/m  Physical Exam Vitals and nursing note reviewed.  Constitutional:      General: She is not in acute distress.    Appearance: She is well-developed.  HENT:     Head: Normocephalic and atraumatic.     Right Ear: Tympanic membrane normal.     Left Ear: Tympanic membrane normal.     Nose: Nose normal.     Mouth/Throat:     Mouth: Mucous membranes are moist.     Pharynx: Oropharynx is clear.     Comments: Mild posterior pharyngeal erythema.  Uvula  midline rise symmetric with phonation.  No sublingual submandibular swelling.  Tonsils are 1+ bilaterally with no obvious erythema or exudate. Eyes:     Extraocular Movements: Extraocular movements intact.     Conjunctiva/sclera: Conjunctivae normal.     Pupils: Pupils are equal, round, and reactive to light.  Cardiovascular:     Rate and Rhythm: Normal rate and regular rhythm.     Heart sounds: No murmur heard. Pulmonary:     Effort: Pulmonary effort is normal. No respiratory distress.     Breath sounds: Normal breath sounds. No wheezing, rhonchi or rales.  Abdominal:     Palpations: Abdomen is soft.     Tenderness: There is no abdominal tenderness.  Musculoskeletal:         General: No swelling.     Cervical back: Neck supple.     Right lower leg: No edema.     Left lower leg: No edema.     Comments: Patient with small puncture wound noted on the distal aspect of dorsal lateral third digit just to the side of the nailbed.  No surrounding erythema, palpable fluctuance.  No nailbed involvement.  Patient full range of motion of affected digit.  Skin:    General: Skin is warm and dry.     Capillary Refill: Capillary refill takes less than 2 seconds.  Neurological:     Mental Status: She is alert.  Psychiatric:        Mood and Affect: Mood normal.     ED Results / Procedures / Treatments   Labs (all labs ordered are listed, but only abnormal results are displayed) Labs Reviewed  RESP PANEL BY RT-PCR (RSV, FLU A&B, COVID)  RVPGX2    EKG None  Radiology No results found.  Procedures Procedures    Medications Ordered in ED Medications - No data to display  ED Course/ Medical Decision Making/ A&P                             Medical Decision Making Risk Prescription drug management.   This patient presents to the ED for concern of cat bite, cough, this involves an extensive number of treatment options, and is a complaint that carries with it a high risk of complications and morbidity.  The differential diagnosis includes rabies, cellulitis, erysipelas, abscess formation, RSV, COVID, influenza, pneumonia, sepsis, meningitis, viral URI   Co morbidities that complicate the patient evaluation  See HPI   Additional history obtained:  Additional history obtained from EMR External records from outside source obtained and reviewed including hospital records   Lab Tests:  I Ordered, and personally interpreted labs.  The pertinent results include: Viral respiratory panel which is pending   Imaging Studies ordered:  N/a   Cardiac Monitoring: / EKG:  The patient was maintained on a cardiac monitor.  I personally viewed and  interpreted the cardiac monitored which showed an underlying rhythm of: Sinus rhythm   Consultations Obtained:  N/a   Problem List / ED Course / Critical interventions / Medication management  Cat bite Reevaluation of the patientshowed that the patient stayed the same I have reviewed the patients home medicines and have made adjustments as needed   Social Determinants of Health:  Denies tobacco, illicit drug use   Test / Admission - Considered:  Cat bite, viral URI Vitals signs within normal range and stable throughout visit. Laboratory studies significant for: See above 32 year old female presents emergency department  after cat bite as well as experiencing some symptoms such as sore throat and cough.  Regarding cat bite, no evidence of surrounding cellulitis or abscess formation/septic tenosynovitis.  Will treat empirically with Augmentin for prophylactic therapy.  Patient's cat is mainly a house cat with only receiving first round of rabies vaccination series but with no current symptoms concerning for cat itself with rabies so we will withhold vaccination series/Ig at this time.  Patient also with cough as well as mildly sore throat.  No evidence clinically of pneumonia.  Symptoms likely secondary to upper respiratory viral illness.  Will obtain viral respiratory swab and have patient to follow results outpatient.  Given patient has been using inhaler more with very mild faint wheeze auscultated, will do short corticosteroid course for treatment of mild asthma exacerbation.  Patient recommended follow-up with primary care for reassessment of symptoms.  Treatment plan discussed at length with patient and she acknowledged understanding was agreeable to said plan.  Patient overall well-appearing, afebrile in no acute distress. Worrisome signs and symptoms were discussed with the patient, and the patient acknowledged understanding to return to the ED if noticed. Patient was stable upon  discharge.          Final Clinical Impression(s) / ED Diagnoses Final diagnoses:  Cat bite, initial encounter  Viral URI with cough    Rx / DC Orders ED Discharge Orders          Ordered    methylPREDNISolone (MEDROL DOSEPAK) 4 MG TBPK tablet        06/29/22 1252    amoxicillin-clavulanate (AUGMENTIN) 875-125 MG tablet  Every 12 hours        06/29/22 1252              Peter Garter, [place] 06/29/22 1344    Wynetta Fines, MD 06/29/22 2212

## 2022-07-18 ENCOUNTER — Ambulatory Visit: Payer: Medicaid Other

## 2022-09-17 DIAGNOSIS — E669 Obesity, unspecified: Secondary | ICD-10-CM | POA: Diagnosis not present

## 2022-09-17 DIAGNOSIS — F320 Major depressive disorder, single episode, mild: Secondary | ICD-10-CM | POA: Diagnosis not present

## 2022-09-17 DIAGNOSIS — Z823 Family history of stroke: Secondary | ICD-10-CM | POA: Diagnosis not present

## 2022-09-17 DIAGNOSIS — Z809 Family history of malignant neoplasm, unspecified: Secondary | ICD-10-CM | POA: Diagnosis not present

## 2022-09-17 DIAGNOSIS — Z833 Family history of diabetes mellitus: Secondary | ICD-10-CM | POA: Diagnosis not present

## 2022-09-17 DIAGNOSIS — Z008 Encounter for other general examination: Secondary | ICD-10-CM | POA: Diagnosis not present

## 2022-09-17 DIAGNOSIS — Z888 Allergy status to other drugs, medicaments and biological substances status: Secondary | ICD-10-CM | POA: Diagnosis not present

## 2022-09-17 DIAGNOSIS — E119 Type 2 diabetes mellitus without complications: Secondary | ICD-10-CM | POA: Diagnosis not present

## 2022-09-17 DIAGNOSIS — Z6833 Body mass index (BMI) 33.0-33.9, adult: Secondary | ICD-10-CM | POA: Diagnosis not present

## 2022-09-17 DIAGNOSIS — Z818 Family history of other mental and behavioral disorders: Secondary | ICD-10-CM | POA: Diagnosis not present

## 2022-09-17 DIAGNOSIS — J45909 Unspecified asthma, uncomplicated: Secondary | ICD-10-CM | POA: Diagnosis not present

## 2022-09-17 DIAGNOSIS — Z87891 Personal history of nicotine dependence: Secondary | ICD-10-CM | POA: Diagnosis not present

## 2022-09-17 DIAGNOSIS — Z8249 Family history of ischemic heart disease and other diseases of the circulatory system: Secondary | ICD-10-CM | POA: Diagnosis not present

## 2022-11-12 ENCOUNTER — Inpatient Hospital Stay: Payer: Medicaid Other

## 2022-11-12 ENCOUNTER — Encounter: Payer: Self-pay | Admitting: Genetic Counselor

## 2022-11-12 ENCOUNTER — Other Ambulatory Visit: Payer: Self-pay | Admitting: Genetic Counselor

## 2022-11-12 ENCOUNTER — Inpatient Hospital Stay: Payer: Medicaid Other | Attending: Genetic Counselor | Admitting: Genetic Counselor

## 2022-11-12 DIAGNOSIS — Z803 Family history of malignant neoplasm of breast: Secondary | ICD-10-CM

## 2022-11-12 DIAGNOSIS — Z808 Family history of malignant neoplasm of other organs or systems: Secondary | ICD-10-CM | POA: Diagnosis not present

## 2022-11-12 DIAGNOSIS — Z1379 Encounter for other screening for genetic and chromosomal anomalies: Secondary | ICD-10-CM

## 2022-11-12 LAB — GENETIC SCREENING ORDER

## 2022-11-12 NOTE — Progress Notes (Signed)
REFERRING PROVIDER: [facility] [address] [place],  [place] [address]  PRIMARY PROVIDER:  [facility]  PRIMARY REASON FOR VISIT:  1. Family history of breast cancer in mother    HISTORY OF PRESENT ILLNESS:   Sheryl Berg, a 32 y.o. female, was seen for a [place] cancer genetics consultation at the request of Dr. [facility] due to a family history of cancer.  Sheryl Berg presents to clinic today to discuss the possibility of a hereditary predisposition to cancer, to discuss genetic testing, and to further clarify her future cancer risks, as well as potential cancer risks for family members.   Sheryl Berg is a 32 y.o. female with no personal history of cancer.    RISK FACTORS:  Menarche was at age 27.  First live birth at age 35.  OCP use for approximately  1 month   Ovaries intact: yes.  Uterus intact: yes.  Menopausal status: premenopausal.  HRT use: 0 years. Colonoscopy: no Mammogram within the last year: no. Number of breast biopsies: 0 Any excessive radiation exposure in the past: no  Past Medical History:  Diagnosis Date   Asthma    inhaler prn (states does not need often)   Hx of chlamydia infection     Past Surgical History:  Procedure Laterality Date   FINGER SURGERY     age 54 finger abcess and again in 2015   TUBAL LIGATION N/A 10/28/2014   Procedure: BILATERAL DISTAL POST PARTUM TUBAL LIGATION;  Surgeon: Tracey Harries, MD;  Location: [facility];  Service: Gynecology;  Laterality: N/A;   WISDOM TOOTH EXTRACTION      Social History   Socioeconomic History   Marital status: Single    Spouse name: Not on file   Number of children: Not on file   Years of education: Not on file   Highest education level: Not on file  Occupational History   Not on file  Tobacco Use   Smoking status: Some Days   Smokeless tobacco: Never  Substance and Sexual Activity   Alcohol use: No   Drug use: No    Sexual activity: Yes  Other Topics Concern   Not on file  Social History Narrative   Not on file   Social Determinants of Health   Financial Resource Strain: Not on file  Food Insecurity: Not on file  Transportation Needs: Not on file  Physical Activity: Not on file  Stress: Not on file  Social Connections: Not on file     FAMILY HISTORY:  We obtained a detailed, 4-generation family history.  Significant diagnoses are listed below: Family History  Problem Relation Age of Onset   Breast cancer Mother 98   Bone cancer Half-Brother 17 - 65       paternal half-brother     Sheryl Berg is unaware of previous family history of genetic testing for hereditary cancer risks. Of note, she has limited information about her maternal family medical history. There is no reported Ashkenazi Jewish ancestry.   GENETIC COUNSELING ASSESSMENT: Sheryl Berg is a 32 y.o. female with a family history of cancer which is somewhat suggestive of a hereditary predisposition to cancer given her mother's young age at breast cancer diagnosis (<50). We, therefore, discussed and recommended the following at today's visit.   DISCUSSION: We discussed that 5 - 10% of cancer is hereditary, with most cases of hereditary breast cancer associated with BRCA1/2.  There are other genes  that can be associated with hereditary breast cancer syndromes.  We discussed that testing is beneficial for several reasons, including knowing about other cancer risks, identifying potential screening and risk-reduction options that may be appropriate, and to understanding if other family members could be at risk for cancer and allowing them to undergo genetic testing.  We reviewed the characteristics, features and inheritance patterns of hereditary cancer syndromes. We also discussed genetic testing, including the appropriate family members to test, the process of testing, insurance coverage and turn-around-time for results. We discussed the  implications of a negative, positive, carrier and/or variant of uncertain significant result. We discussed that negative results would be uninformative given that Sheryl Berg does not have a personal history of cancer. We recommended Sheryl Berg pursue genetic testing for a panel that contains genes associated with breast cancer.  Sheryl Berg was offered a common hereditary cancer panel (34 genes) and an expanded pan-cancer panel (71 genes). Sheryl Berg was informed of the benefits and limitations of each panel, including that expanded pan-cancer panels contain several genes that do not have clear management guidelines at this point in time.  We also discussed that as the number of genes included on a panel increases, the chances of variants of uncertain significance increases.  After considering the benefits and limitations of each gene panel, Sheryl Berg elected to have [facility] CancerNext-Expanded Panel.  The CancerNext-Expanded gene panel offered by [facility] and includes sequencing, rearrangement, and RNA analysis for the following 71 genes: AIP, ALK, APC, ATM, AXIN2, BAP1, BARD1, BMPR1A, BRCA1, BRCA2, BRIP1, CDC73, CDH1, CDK4, CDKN1B, CDKN2A, CHEK2, CTNNA1, DICER1, FH, FLCN, KIF1B, LZTR1, MAX, MEN1, MET, MLH1, MSH2, MSH3, MSH6, MUTYH, NF1, NF2, NTHL1, PALB2, PHOX2B, PMS2, POT1, PRKAR1A, PTCH1, PTEN, RAD51C, RAD51D, RB1, RET, SDHA, SDHAF2, SDHB, SDHC, SDHD, SMAD4, SMARCA4, SMARCB1, SMARCE1, STK11, SUFU, TMEM127, TP53, TSC1, TSC2, and VHL (sequencing and deletion/duplication); EGFR, EGLN1, HOXB13, KIT, MITF, PDGFRA, POLD1, and POLE (sequencing only); EPCAM and GREM1 (deletion/duplication only).    Based on Sheryl Berg's family history of cancer, she meets medical criteria for genetic testing. Despite that she meets criteria, she may still have an out of pocket cost. We discussed that if her out of pocket cost for testing is over $100, the laboratory should contact them to discuss self-pay prices,  patient pay assistance programs, if applicable, and other billing options.  We discussed that some people do not want to undergo genetic testing due to fear of genetic discrimination.  A federal law called the Genetic Information Non-Discrimination Act (GINA) of 2008 helps protect individuals against genetic discrimination based on their genetic test results.  It impacts both health insurance and employment.  With health insurance, it protects against increased premiums, being kicked off insurance or being forced to take a test in order to be insured.  For employment it protects against hiring, firing and promoting decisions based on genetic test results.  GINA does not apply to those in the [employer], those who work for companies with less than 15 employees, and new life insurance or long-term disability insurance policies.  Health status due to a cancer diagnosis is not protected under GINA.  PLAN: After considering the risks, benefits, and limitations, Sheryl Berg provided informed consent to pursue genetic testing and the blood sample was sent to [facility] for analysis of the CancerNext-Expanded Panel. Results should be available within approximately 2-3 weeks' time, at which point they will be disclosed by telephone to Sheryl Berg, as will any additional recommendations warranted by  these results. Sheryl Berg will receive a summary of her genetic counseling visit and a copy of her results once available. This information will also be available in Epic.   Sheryl Berg questions were answered to her satisfaction today. Our contact information was provided should additional questions or concerns arise. Thank you for the referral and allowing [place] to share in the care of your patient.   [name], MS, [facility] Genetic Counselor [email or web address] (P) [phone]  The patient was seen for a total of 25 minutes in face-to-face genetic counseling.  The patient brought her  boyfriend.  Drs. Pamelia Hoit and/or Mosetta Putt were available to discuss this case as needed.  _______________________________________________________________________ For Office Staff:  Number of people involved in session: 2 Was an Intern/ student involved with case: no

## 2022-11-28 ENCOUNTER — Telehealth: Payer: Self-pay | Admitting: Genetic Counselor

## 2022-11-28 ENCOUNTER — Encounter: Payer: Self-pay | Admitting: Genetic Counselor

## 2022-11-28 ENCOUNTER — Ambulatory Visit: Payer: Self-pay | Admitting: Genetic Counselor

## 2022-11-28 DIAGNOSIS — Z1379 Encounter for other screening for genetic and chromosomal anomalies: Secondary | ICD-10-CM | POA: Insufficient documentation

## 2022-11-28 NOTE — Telephone Encounter (Signed)
I contacted Sheryl Berg to discuss her genetic testing results. No pathogenic variants were identified in the 71 genes analyzed. 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 Detailed clinic note to follow.  The test report has been scanned into EPIC and is located under the Molecular Pathology section of the Results Review tab.  A portion of the result report is included below for reference.   [name], MS, [facility] Genetic Counselor [email or web address] (P) [phone]

## 2022-11-28 NOTE — Progress Notes (Signed)
HPI:   Ms. Disotell was previously seen in the [facility] due to a family history of cancer and concerns regarding a hereditary predisposition to cancer. Please refer to our prior cancer genetics clinic note for more information regarding our discussion, assessment and recommendations, at the time. Ms. Monsma recent genetic test results were disclosed to her, as were recommendations warranted by these results. These results and recommendations are discussed in more detail below.  CANCER HISTORY:  Oncology History   No history exists.    FAMILY HISTORY:  We obtained a detailed, 4-generation family history.  Significant diagnoses are listed below:      Family History  Problem Relation Age of Onset   Breast cancer Mother 73   Bone cancer Half-Brother 42 - 67        paternal half-brother           Ms. Macri is unaware of previous family history of genetic testing for hereditary cancer risks. Of note, she has limited information about her maternal family medical history. There is no reported Ashkenazi Jewish ancestry.   GENETIC TEST RESULTS:  The [facility] CancerNext-Expanded Panel found no pathogenic mutations.   The CancerNext-Expanded gene panel offered by [facility] and includes sequencing, rearrangement, and RNA analysis for the following 71 genes: AIP, ALK, APC, ATM, AXIN2, BAP1, BARD1, BMPR1A, BRCA1, BRCA2, BRIP1, CDC73, CDH1, CDK4, CDKN1B, CDKN2A, CHEK2, CTNNA1, DICER1, FH, FLCN, KIF1B, LZTR1, MAX, MEN1, MET, MLH1, MSH2, MSH3, MSH6, MUTYH, NF1, NF2, NTHL1, PALB2, PHOX2B, PMS2, POT1, PRKAR1A, PTCH1, PTEN, RAD51C, RAD51D, RB1, RET, SDHA, SDHAF2, SDHB, SDHC, SDHD, SMAD4, SMARCA4, SMARCB1, SMARCE1, STK11, SUFU, TMEM127, TP53, TSC1, TSC2, and VHL (sequencing and deletion/duplication); EGFR, EGLN1, HOXB13, KIT, MITF, PDGFRA, POLD1, and POLE (sequencing only); EPCAM and GREM1 (deletion/duplication only).   The test report has been scanned into EPIC and is  located under the Molecular Pathology section of the Results Review tab.  A portion of the result report is included below for reference. Genetic testing reported out on 11/28/2022.       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  At this time, it is unknown if this variant is associated with an increased risk for cancer or if it is benign, but most uncertain variants are reclassified to benign. It should not be used to make medical management decisions. With time, we suspect the laboratory will determine the significance of this variant, if any. If the laboratory reclassifies this variant, we will attempt to contact Ms. Minton to discuss it further.   Even though a pathogenic variant was not identified, possible explanations for the cancer in the family may include: There may be no hereditary risk for cancer in the family. The cancers in her family may be due to other genetic or environmental factors. There may be a gene mutation in one of these genes that current testing methods cannot detect, but that chance is small. There could be another gene that has not yet been discovered, or that we have not yet tested, that is responsible for the cancer diagnoses in the family.  It is also possible there is a hereditary cause for the cancer in the family that Ms. Pennino did not inherit.  Therefore, it is important to remain in touch with cancer genetics in the future so that we can continue to offer Ms. Munyon the most up to date genetic testing.   ADDITIONAL GENETIC TESTING:  We discussed with Ms. Westerhoff  that her genetic testing was fairly extensive.  If there are genes identified to increase cancer risk that can be analyzed in the future, we would be happy to discuss and coordinate this testing at that time.    CANCER SCREENING RECOMMENDATIONS:  Ms. Kummer test result is considered negative (normal).  This means that we have not  identified a hereditary cause for her family history of cancer at this time.   An individual's cancer risk and medical management are not determined by genetic test results alone. Overall cancer risk assessment incorporates additional factors, including personal medical history, family history, and any available genetic information that may result in a personalized plan for cancer prevention and surveillance. Therefore, it is recommended she continue to follow the cancer management and screening guidelines provided by her primary healthcare provider.  Based on the reported personal and family history, specific cancer screenings for Ms. Shermika Canupp and her family include:  Breast Cancer Screening:  The Tyrer-Cuzick model is one of multiple prediction models developed to estimate an individual's lifetime risk of developing breast cancer. The Tyrer-Cuzick model is endorsed by the [employer] ([employer]CCN). This model includes many risk factors such as family history, endogenous estrogen exposure, and benign breast disease. The calculation is highly-dependent on the accuracy of clinical data provided by the patient and can change over time. The Tyrer-Cuzick model may be repeated to reflect new information in her personal or family history in the future.   Ms. Ginther'sTyrer-Cuzick risk score is 16.9%. This is above the general population risk of 12%, therefore, she is encouraged to continue to be mindful of her family history and be diligent with general population breast screening, including annual mammograms beginning 10 years prior to the youngest diagnosis in her family or by age 38.  She is encouraged to contact us regarding any changes to her personal or family history, as her recommendations for screening would be altered significantly if her lifetime risk is determined to be greater than 20% based on updated information.         RECOMMENDATIONS FOR FAMILY MEMBERS:    Since she did not inherit a mutation in a cancer predisposition gene included on this panel, her children could not have inherited a mutation from her in one of these genes. Individuals in this family might be at some increased risk of developing cancer, over the general population risk, due to the family history of cancer. We recommend women in this family have a yearly mammogram beginning at age 72, or 6 years younger than the earliest onset of cancer, an annual clinical breast exam, and perform monthly breast self-exams.  Other members of the family may still carry a pathogenic variant in one of these genes that Ms. Papin did not inherit. Based on the family history, we recommend her mother have genetic counseling and testing.  We do not recommend familial testing for the MET variant of uncertain significance (VUS).  FOLLOW-UP:  Cancer genetics is a rapidly advancing field and it is possible that new genetic tests will be appropriate for her and/or her family members in the future. We encouraged her to remain in contact with cancer genetics on an annual basis so we can update her personal and family histories and let her know of advances in cancer genetics that may benefit this family.   Our contact number was provided. Ms. Beals questions were answered to her satisfaction, and she knows she is welcome to call us at anytime with additional  questions or concerns.   [name], MS, [facility] Genetic Counselor [email or web address] (P) [phone]

## 2022-12-06 ENCOUNTER — Encounter: Payer: Self-pay | Admitting: Genetic Counselor

## 2023-03-25 DIAGNOSIS — Z8249 Family history of ischemic heart disease and other diseases of the circulatory system: Secondary | ICD-10-CM | POA: Diagnosis not present

## 2023-03-25 DIAGNOSIS — Z87891 Personal history of nicotine dependence: Secondary | ICD-10-CM | POA: Diagnosis not present

## 2023-03-25 DIAGNOSIS — J45909 Unspecified asthma, uncomplicated: Secondary | ICD-10-CM | POA: Diagnosis not present

## 2023-03-25 DIAGNOSIS — Z809 Family history of malignant neoplasm, unspecified: Secondary | ICD-10-CM | POA: Diagnosis not present

## 2023-03-25 DIAGNOSIS — E669 Obesity, unspecified: Secondary | ICD-10-CM | POA: Diagnosis not present

## 2023-03-25 DIAGNOSIS — Z823 Family history of stroke: Secondary | ICD-10-CM | POA: Diagnosis not present

## 2023-03-25 DIAGNOSIS — Z833 Family history of diabetes mellitus: Secondary | ICD-10-CM | POA: Diagnosis not present

## 2023-03-25 DIAGNOSIS — Z7951 Long term (current) use of inhaled steroids: Secondary | ICD-10-CM | POA: Diagnosis not present

## 2023-03-25 DIAGNOSIS — Z91199 Patient's noncompliance with other medical treatment and regimen due to unspecified reason: Secondary | ICD-10-CM | POA: Diagnosis not present

## 2023-03-25 DIAGNOSIS — Z6830 Body mass index (BMI) 30.0-30.9, adult: Secondary | ICD-10-CM | POA: Diagnosis not present

## 2024-01-06 NOTE — Progress Notes (Signed)
 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 Amended report date 01/05/2024.
